# Patient Record
Sex: Female | Born: 1980 | Smoking: Current every day smoker
Health system: Southern US, Community
[De-identification: ages and names within clinical notes are randomized; demographics above are authoritative.]

## PROBLEM LIST (undated history)

## (undated) ENCOUNTER — Inpatient Hospital Stay (HOSPITAL_COMMUNITY): Payer: Self-pay

## (undated) DIAGNOSIS — F1110 Opioid abuse, uncomplicated: Secondary | ICD-10-CM

## (undated) DIAGNOSIS — O24419 Gestational diabetes mellitus in pregnancy, unspecified control: Secondary | ICD-10-CM

## (undated) DIAGNOSIS — E119 Type 2 diabetes mellitus without complications: Secondary | ICD-10-CM

## (undated) DIAGNOSIS — F32A Depression, unspecified: Secondary | ICD-10-CM

## (undated) DIAGNOSIS — F329 Major depressive disorder, single episode, unspecified: Secondary | ICD-10-CM

## (undated) DIAGNOSIS — F419 Anxiety disorder, unspecified: Secondary | ICD-10-CM

## (undated) HISTORY — PX: EYE SURGERY: SHX253

## (undated) HISTORY — DX: Type 2 diabetes mellitus without complications: E11.9

---
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
## 2002-09-24 ENCOUNTER — Other Ambulatory Visit: Admission: RE | Admit: 2002-09-24 | Discharge: 2002-09-24 | Payer: Self-pay | Admitting: *Deleted

## 2003-02-16 ENCOUNTER — Ambulatory Visit (HOSPITAL_COMMUNITY): Admission: RE | Admit: 2003-02-16 | Discharge: 2003-02-16 | Payer: Self-pay | Admitting: Unknown Physician Specialty

## 2003-02-16 ENCOUNTER — Encounter: Payer: Self-pay | Admitting: Unknown Physician Specialty

## 2003-03-31 ENCOUNTER — Ambulatory Visit (HOSPITAL_COMMUNITY): Admission: RE | Admit: 2003-03-31 | Discharge: 2003-03-31 | Payer: Self-pay | Admitting: Unknown Physician Specialty

## 2003-03-31 ENCOUNTER — Encounter: Payer: Self-pay | Admitting: Unknown Physician Specialty

## 2004-04-14 ENCOUNTER — Emergency Department (HOSPITAL_COMMUNITY): Admission: EM | Admit: 2004-04-14 | Discharge: 2004-04-14 | Payer: Self-pay | Admitting: Emergency Medicine

## 2004-07-19 ENCOUNTER — Emergency Department (HOSPITAL_COMMUNITY): Admission: EM | Admit: 2004-07-19 | Discharge: 2004-07-19 | Payer: Self-pay | Admitting: Emergency Medicine

## 2004-07-29 ENCOUNTER — Emergency Department (HOSPITAL_COMMUNITY): Admission: EM | Admit: 2004-07-29 | Discharge: 2004-07-29 | Payer: Self-pay | Admitting: Emergency Medicine

## 2005-05-08 ENCOUNTER — Ambulatory Visit: Payer: Self-pay | Admitting: Nurse Practitioner

## 2005-05-17 ENCOUNTER — Ambulatory Visit (HOSPITAL_COMMUNITY): Admission: RE | Admit: 2005-05-17 | Discharge: 2005-05-17 | Payer: Self-pay | Admitting: Internal Medicine

## 2005-07-24 ENCOUNTER — Other Ambulatory Visit: Admission: RE | Admit: 2005-07-24 | Discharge: 2005-07-24 | Payer: Self-pay | Admitting: Obstetrics and Gynecology

## 2005-08-19 IMAGING — CR DG HAND COMPLETE 3+V*L*
2 series · 2 of 2 positions shown · non-contrast
Comparison: none

HISTORY: Pain, swelling, injury

LEFT FOREARM 2 VIEWS:
No fracture, dislocation, or bone destruction.
Joint spaces preserved.

[view not recorded (1 of 2)]
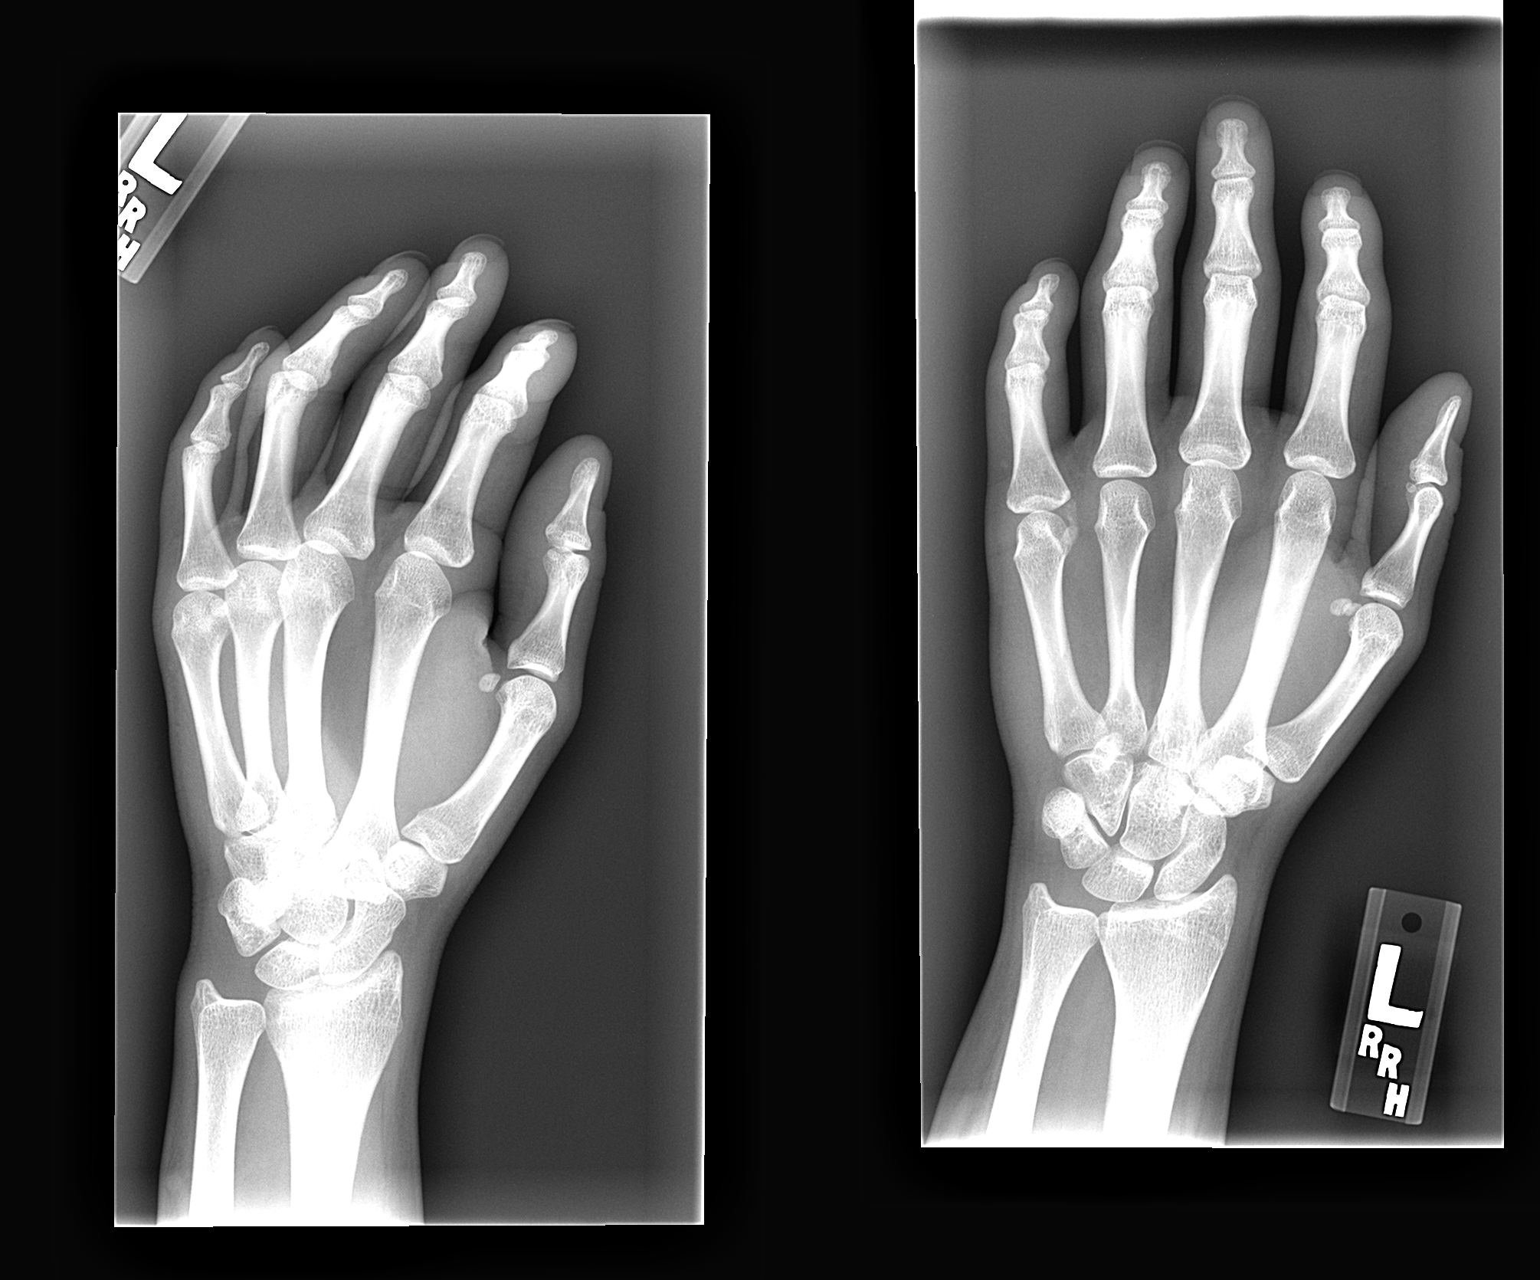

[view not recorded (2 of 2)]
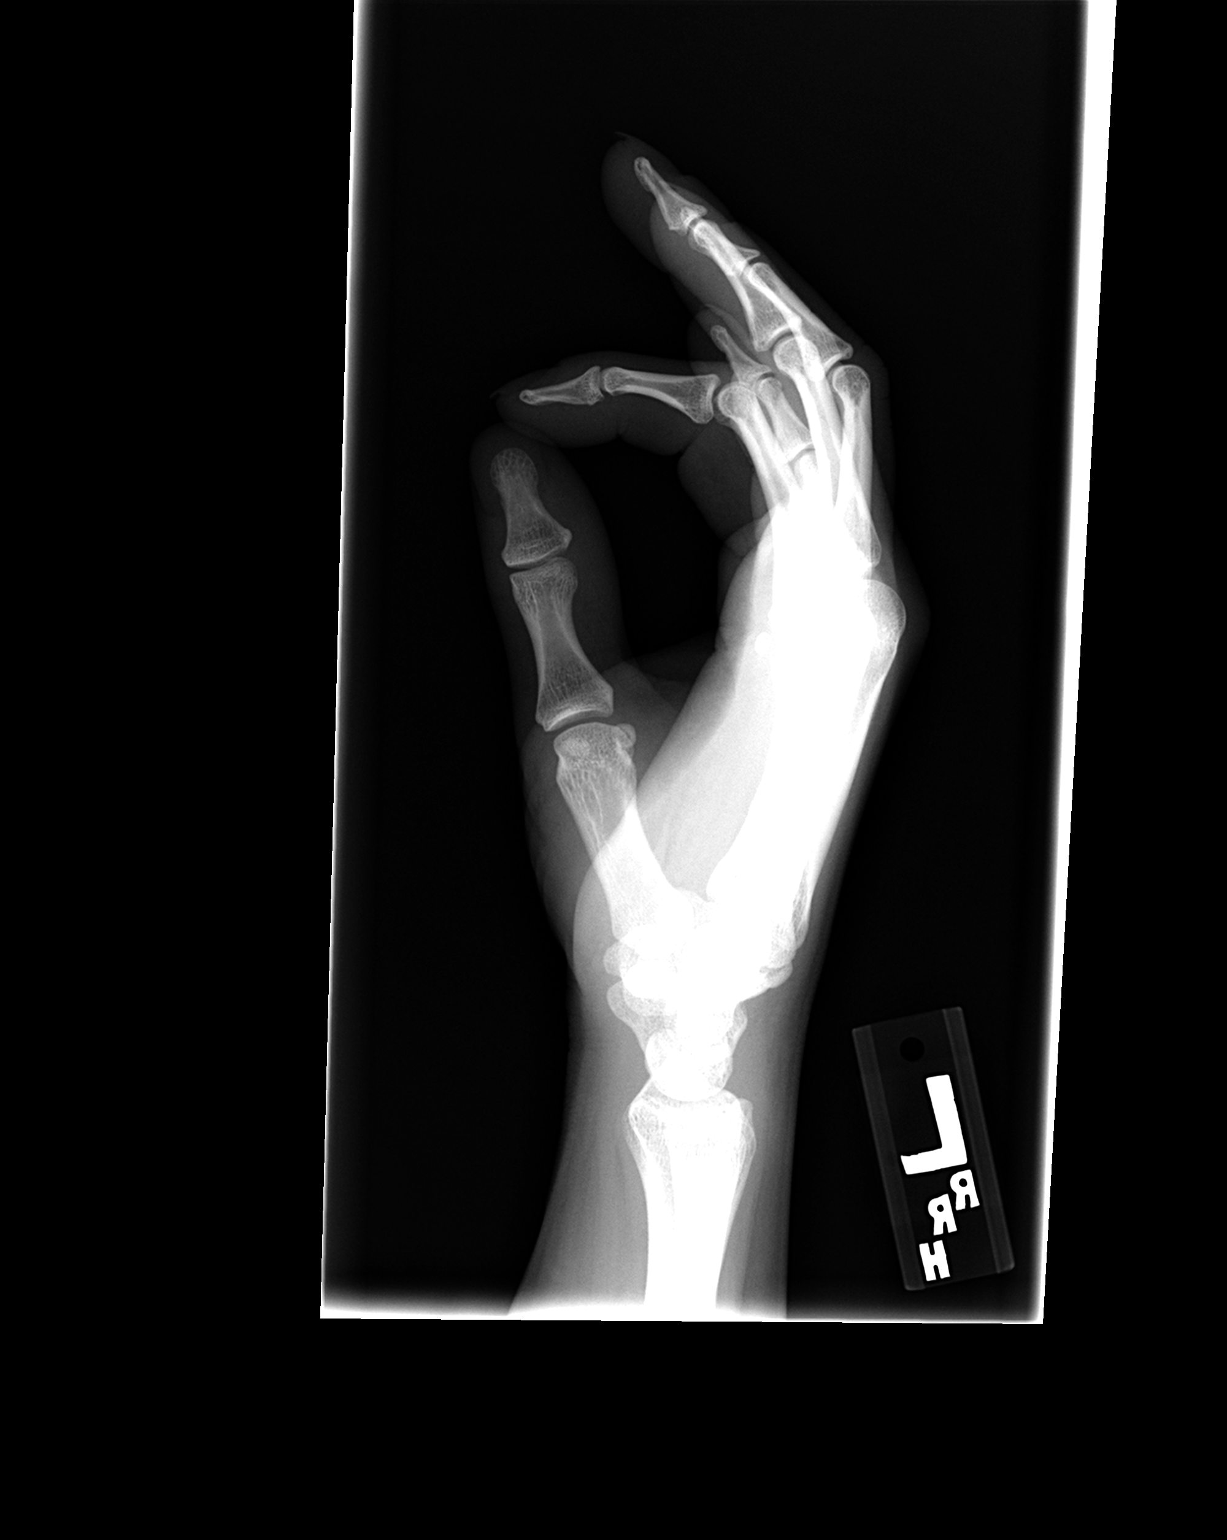

[2 of 2 positions shown; findings below may reference images not displayed]

IMPRESSION: No acute abnormalities.

LEFT HAND 3 VIEWS:

Fingers flexed on all views.
No definite fracture, dislocation, or bone destruction.
Mineralization normal.
Joint spaces preserved.
On AP view, question vague calcification identified at radial aspect of fifth
MCP joint, of uncertain etiology and significance.
This is not well visualized on the oblique or lateral films.
IMPRESSION: No definite acute bony abnormalities.
Questionable density at the MCP joint on single image, of uncertain significance
No donor site seen to suggest subacute fracture.

## 2005-09-19 ENCOUNTER — Emergency Department (HOSPITAL_COMMUNITY): Admission: EM | Admit: 2005-09-19 | Discharge: 2005-09-19 | Payer: Self-pay | Admitting: Emergency Medicine

## 2006-01-08 ENCOUNTER — Encounter: Admission: RE | Admit: 2006-01-08 | Discharge: 2006-01-16 | Payer: Self-pay | Admitting: Obstetrics & Gynecology

## 2006-02-28 ENCOUNTER — Inpatient Hospital Stay (HOSPITAL_COMMUNITY): Admission: AD | Admit: 2006-02-28 | Discharge: 2006-03-06 | Payer: Self-pay | Admitting: Gynecology

## 2006-03-02 ENCOUNTER — Encounter (INDEPENDENT_AMBULATORY_CARE_PROVIDER_SITE_OTHER): Payer: Self-pay | Admitting: Specialist

## 2006-03-09 ENCOUNTER — Encounter: Admission: RE | Admit: 2006-03-09 | Discharge: 2006-04-08 | Payer: Self-pay | Admitting: Obstetrics and Gynecology

## 2006-06-08 ENCOUNTER — Other Ambulatory Visit: Admission: RE | Admit: 2006-06-08 | Discharge: 2006-06-08 | Payer: Self-pay | Admitting: Family Medicine

## 2006-06-17 ENCOUNTER — Emergency Department (HOSPITAL_COMMUNITY): Admission: EM | Admit: 2006-06-17 | Discharge: 2006-06-18 | Payer: Self-pay | Admitting: Emergency Medicine

## 2007-05-27 ENCOUNTER — Encounter: Admission: RE | Admit: 2007-05-27 | Discharge: 2007-07-03 | Payer: Self-pay | Admitting: Family Medicine

## 2007-07-08 IMAGING — US US PELVIS COMPLETE MODIFY
1 series · 14 of 25 positions shown · non-contrast
Comparison: None

CLINICAL DATA: Back pain, right-sided pelvic pain

TRANSABDOMINAL AND TRANSVAGINAL PELVIC ULTRASOUND
TECHNIQUE: Both transabdominal and transvaginal ultrasound examinations of the
pelvis were performed including evaluation of the uterus, ovaries, adnexal
regions, and pelvic cul-de-sac.

[Series 1: unknown · 0.32mm/px · 14 of 40 slices shown]
[im 1/40]
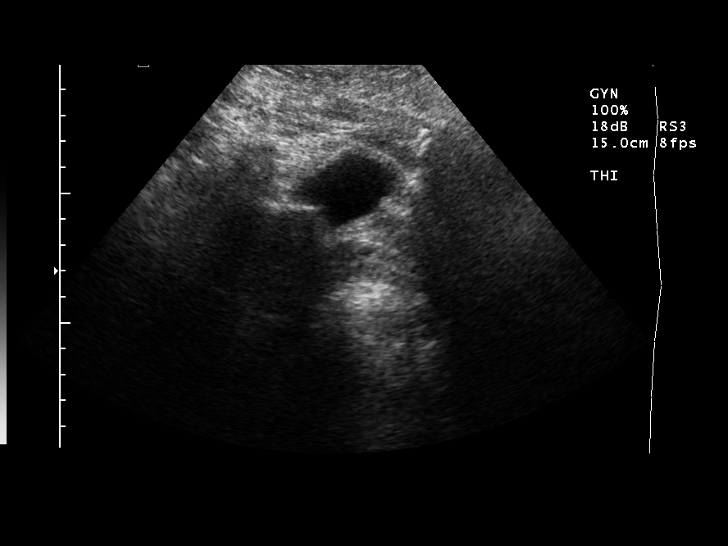
[im 4/40]
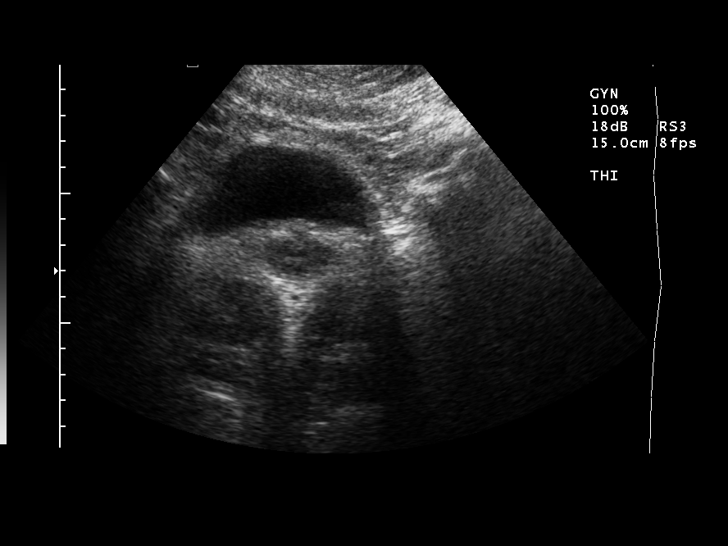
[im 7/40]
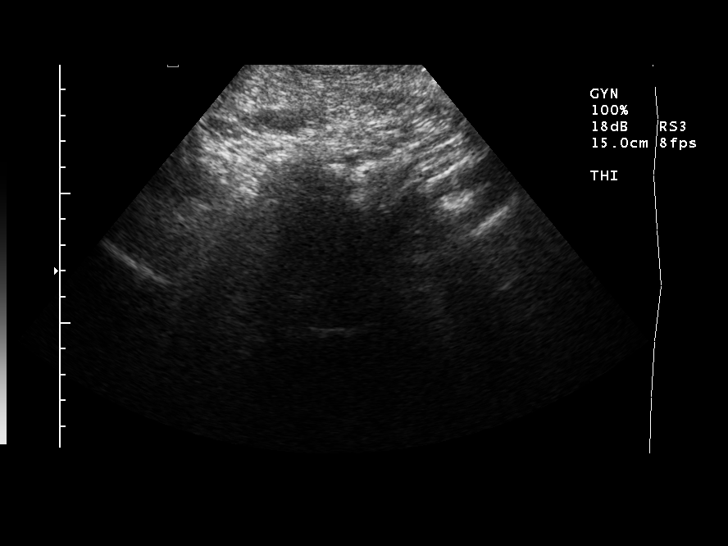
[im 10/40]
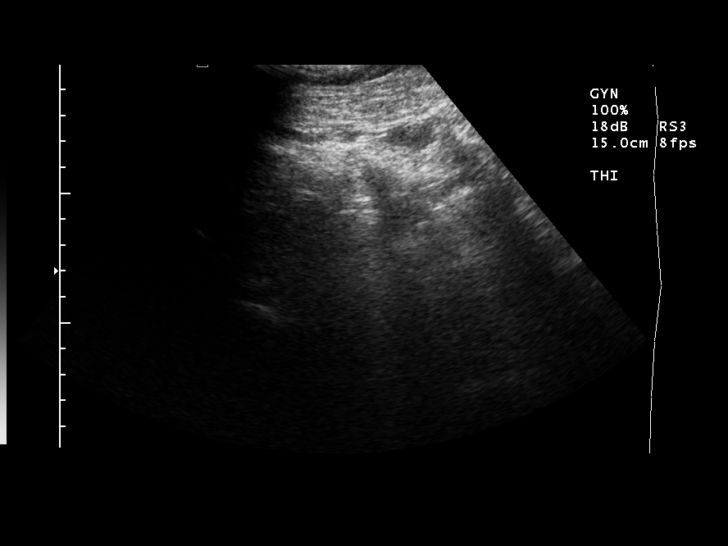
[im 14/40]
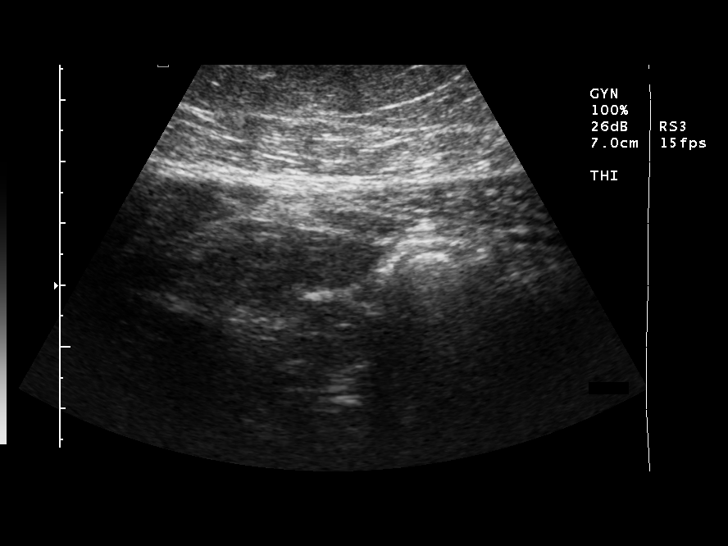
[im 15/40]
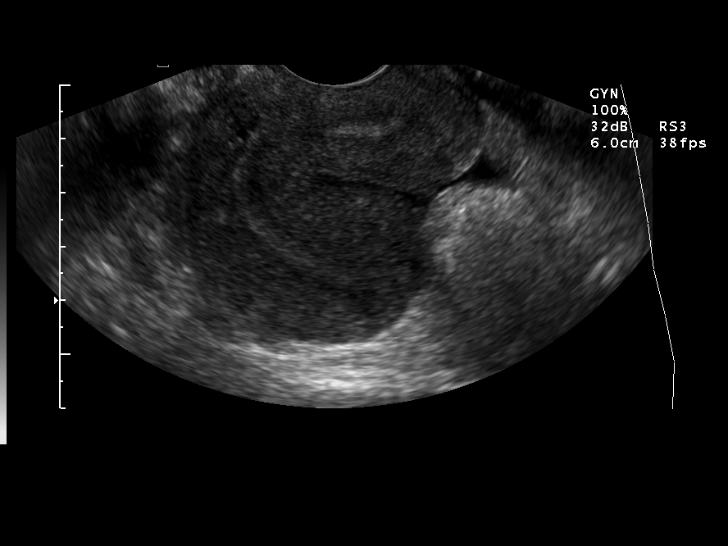
[im 18/40]
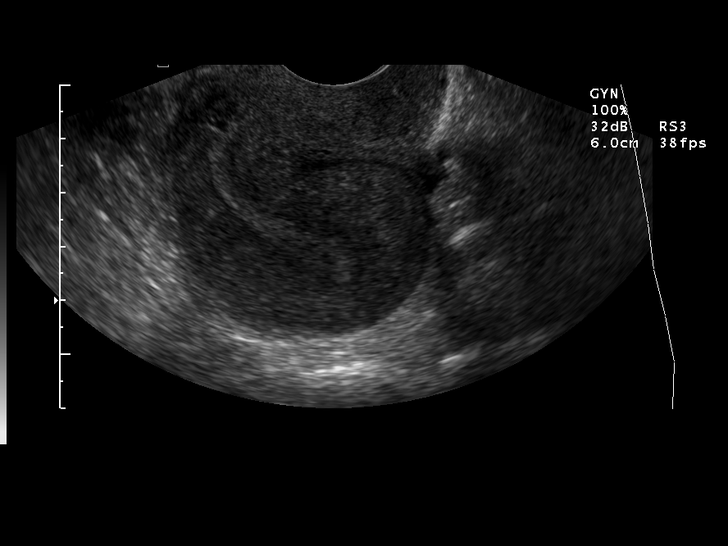
[im 22/40]
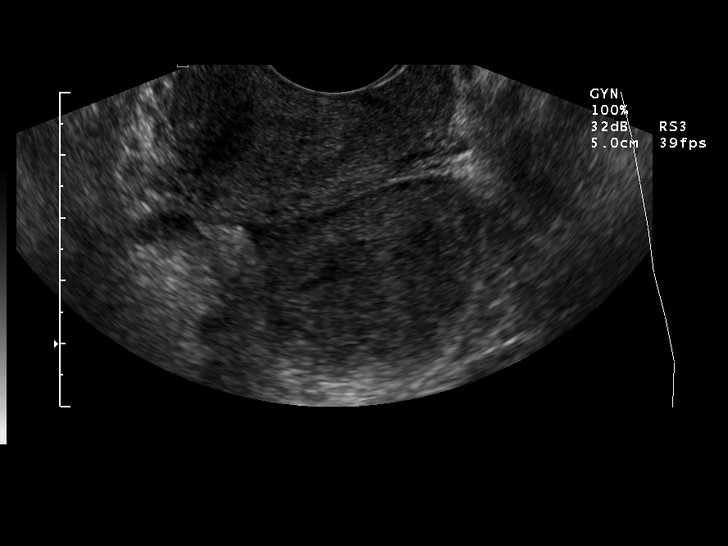
[im 25/40]
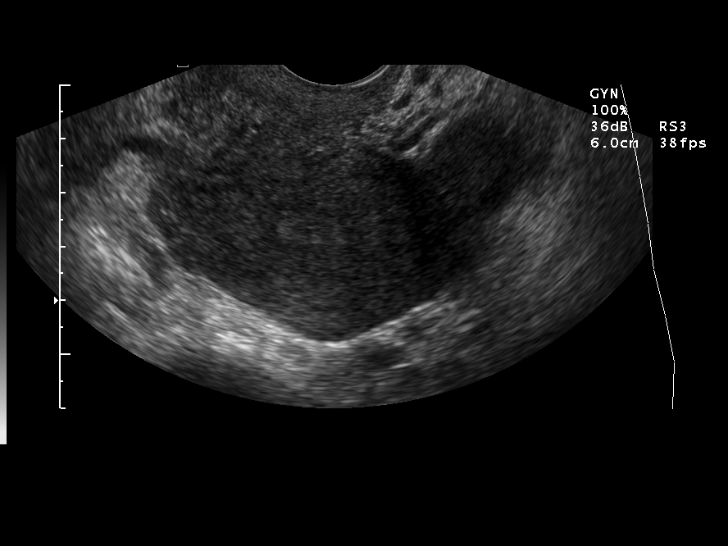
[im 27/40]
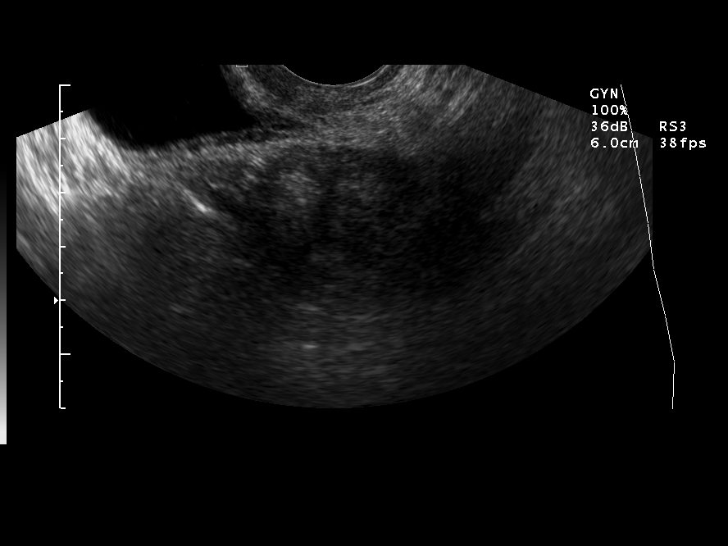
[im 30/40]
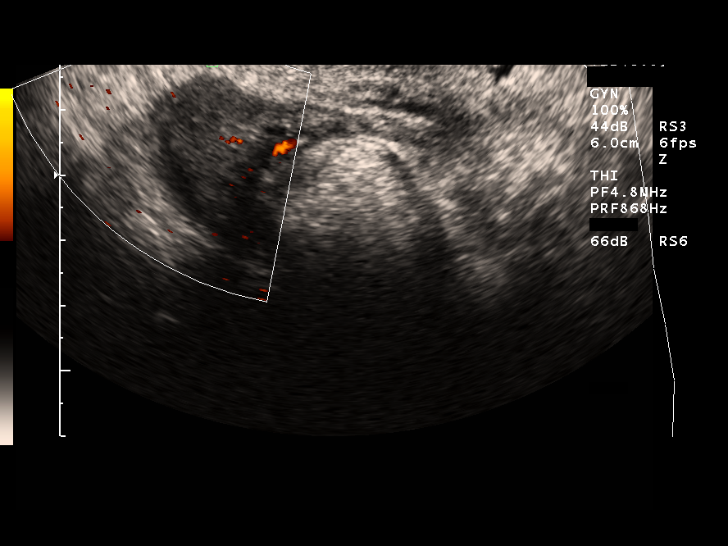
[im 33/40]
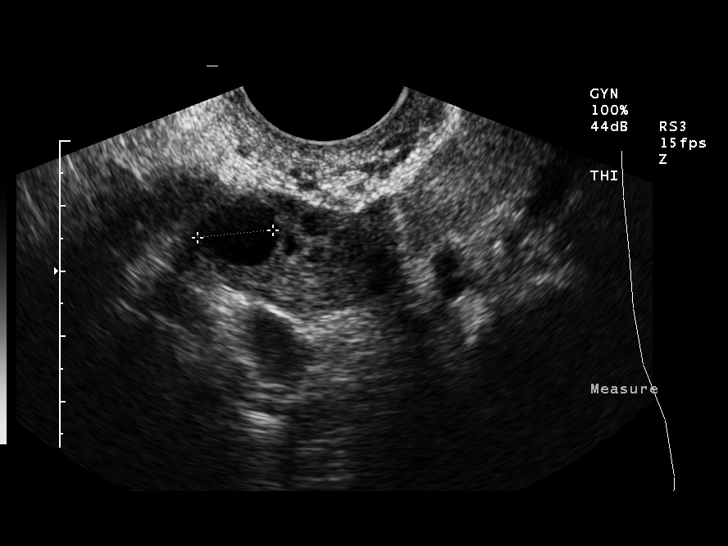
[im 36/40]
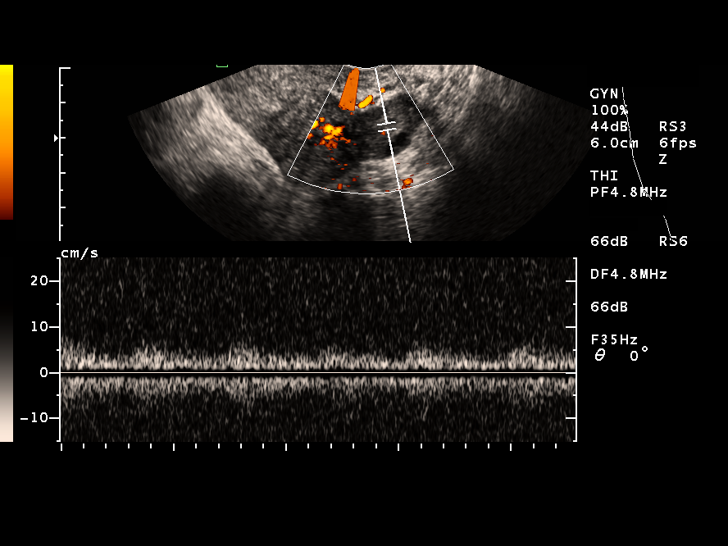
[im 40/40]
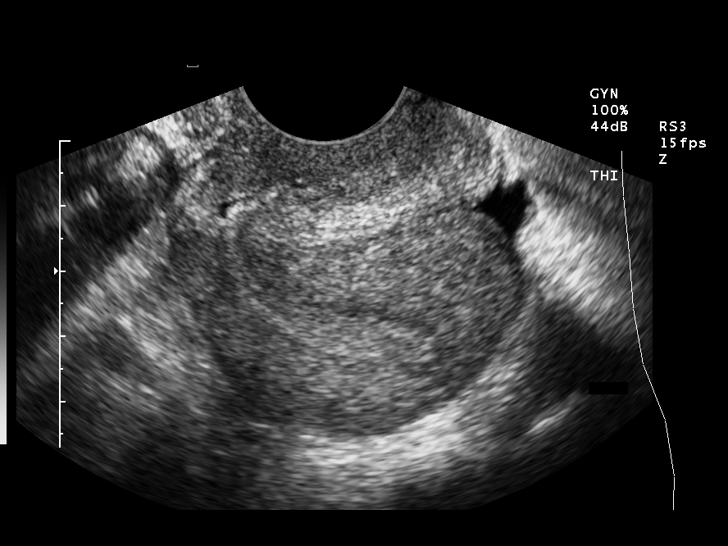

[14 of 25 positions shown; findings below may reference images not displayed]

FINDINGS: Uterus is retroflexed. Endometrial normal at 6 mm. Uterus
unremarkable.

Right ovary measures 2.9 x 2.7 x 1.9 cm. Left ovary measures 3.1 x 1.7 x 2.4 cm.
Multiple small follicles noted bilaterally. A small amount of free fluid is
present.

IMPRESSION

A small amount of free fluid, likely physiologic. Otherwise unremarkable.

## 2008-03-26 ENCOUNTER — Encounter: Admission: RE | Admit: 2008-03-26 | Discharge: 2008-06-08 | Payer: Self-pay | Admitting: Nurse Practitioner

## 2008-05-20 ENCOUNTER — Emergency Department (HOSPITAL_COMMUNITY): Admission: EM | Admit: 2008-05-20 | Discharge: 2008-05-20 | Payer: Self-pay | Admitting: Emergency Medicine

## 2008-05-21 ENCOUNTER — Ambulatory Visit (HOSPITAL_COMMUNITY): Admission: RE | Admit: 2008-05-21 | Discharge: 2008-05-21 | Payer: Self-pay | Admitting: Ophthalmology

## 2008-11-06 ENCOUNTER — Ambulatory Visit: Payer: Self-pay | Admitting: Diagnostic Radiology

## 2008-11-06 ENCOUNTER — Ambulatory Visit (HOSPITAL_BASED_OUTPATIENT_CLINIC_OR_DEPARTMENT_OTHER): Admission: RE | Admit: 2008-11-06 | Discharge: 2008-11-06 | Payer: Self-pay | Admitting: Family Medicine

## 2009-01-27 ENCOUNTER — Ambulatory Visit: Payer: Self-pay | Admitting: Diagnostic Radiology

## 2009-01-27 ENCOUNTER — Ambulatory Visit (HOSPITAL_BASED_OUTPATIENT_CLINIC_OR_DEPARTMENT_OTHER): Admission: RE | Admit: 2009-01-27 | Discharge: 2009-01-27 | Payer: Self-pay | Admitting: Family Medicine

## 2009-02-08 ENCOUNTER — Other Ambulatory Visit: Admission: RE | Admit: 2009-02-08 | Discharge: 2009-02-08 | Payer: Self-pay | Admitting: Family Medicine

## 2010-06-15 ENCOUNTER — Other Ambulatory Visit: Admission: RE | Admit: 2010-06-15 | Discharge: 2010-06-15 | Payer: Self-pay | Admitting: Obstetrics and Gynecology

## 2010-08-28 HISTORY — PX: ROTATOR CUFF REPAIR: SHX139

## 2010-09-18 ENCOUNTER — Encounter: Payer: Self-pay | Admitting: Internal Medicine

## 2011-01-10 NOTE — Op Note (Signed)
NAME:  Audrey Edwards, Audrey Edwards                ACCOUNT NO.:  [id number]   MEDICAL RECORD NO.:  [id number]          PATIENT TYPE:  AMB   LOCATION:  SDS                          FACILITY:  [facility]   PHYSICIAN:  Tyrone Apple. Karleen Edwards, M.D.DATE OF BIRTH:  07-08-1981   DATE OF PROCEDURE:  05/21/2008  DATE OF DISCHARGE:                               OPERATIVE REPORT   PREOPERATIVE DIAGNOSIS:  Complex lid laceration of the right upper lid  posttraumatic.   POSTOPERATIVE DIAGNOSIS:  Complex lid laceration of the right upper lid  posttraumatic.   PROCEDURE:  Repair of complex lid laceration of the right upper lid  involving the tarsal plate and the orbicularis.   SURGEON:  Tyrone Apple. Karleen Hampshire, MD.   ANESTHESIA:  General with endotracheal intubation.   INDICATIONS FOR PROCEDURE:  Audrey Edwards is a 30 year old white female,  who is status post a traumatic lid rupture, and this procedure is  indicated to restore a normal lid anatomy and function.  The risks and  benefits of the procedure explained to the patient and the patient's  family prior to the procedure, and informed consent was obtained.   DESCRIPTION OF PROCEDURE:  The patient was taken into the operating  room, placed in the supine position, and the entire face was prepped and  draped in the usual sterile fashion.  After the induction of our general  anesthesia and the establishment of an endotracheal airway, the  operating microscope was brought into the field.  The laceration was  examined.  It was found to be a rupture of the tarsal plate extending  vertically for approximately 8 to 10 mm and then horizontally for  approximately 12 mm once out of the tarsal plate with displacement of  the lid margins.  Next, the wound was copiously irrigated with Betadine  and also then irrigated with normal saline, and the proximal and distal  margins of the tarsal plate were identified.  Using a 6-0 Vicryl suture,  a suture was placed through the meibomian  orifices approximately 2 to 3  mm from the edge of the distal aspect of the lesion and passed to the  proximal aspect of the wound in the tarsal plate exiting from the  adjacent meibomian gland orifices the same distance from the fracture as  the contralateral suture.  The suture was then tied securely and left  long.  A second 6-0 Vicryl suture was then placed.  This was placed  anterior in the lid margin to the previously placed sutures just  anterior to the lash line, and this was passed through the tarsal plate  through the fracture on the posterior aspect of the wound and then  passed through the tarsal plate of the fracture and exiting on the  posterior aspect of the wound on the contralateral side.  This suture  was also tied securely and left long and then placed on serafins.  Next,  the lateral aspect of the wound extending horizontally was explored and  cleaned, and the subcutaneous tissue and obicularis  fibers were then  engaged with direct visualization, and the wound  defect was closed using  interrupted 8-0 Vicryl sutures along the length of the wound.  Approximately 10 interrupted 8-0 Vicryl sutures were used to close the  laceration.  Once the wound was completely closed, one of the sutures in  the levator pretarsal orbicularis complex was left long, and this was  used to incorporate the previously placed 2 sutures of 6-0 Vicryl  sutures, which were placed in the end-tarsal plate and in this way  pulling the sutures away from the cornea and using this suture to tie  and incarcerate the tarsal plate sutures.  All the sutures were then cut  short.  The wound was then inspected again  for its integrity and its contour.  It was irrigated.  The eye was  examined.  The cornea was also examined and found to be intact.  There  were no retained foreign bodies.  The wound was then irrigated to dry,  and TobraDex ointment was applied.  A protective Fox shield was then  applied to the  eye.  There were no apparent complications.      Audrey Edwards, M.D.  Electronically Signed     MAS/MEDQ  D:  05/21/2008  T:  05/21/2008  Job:  [id number]

## 2011-01-13 NOTE — Discharge Summary (Signed)
NAME:  Audrey Edwards, Audrey Edwards                ACCOUNT NO.:  [id number]   MEDICAL RECORD NO.:  [id number]          PATIENT TYPE:  INP   LOCATION:                                FACILITY:  [facility]   PHYSICIAN:  Malva Limes, M.D.    DATE OF BIRTH:  01-09-1981   DATE OF ADMISSION:  02/28/2006  DATE OF DISCHARGE:  03/06/2006                                 DISCHARGE SUMMARY   FINAL DIAGNOSES:  1.  Intrauterine pregnancy at 38+ weeks gestation.  2.  Oligohydramnios.  3.  Arrest of the active phase of labor.  4.  Chorioamnionitis.  5.  Postoperative anemia.   PROCEDURE:  Primary low transverse cesarean section.   SURGEON:  Dr. Carrington Clamp.   COMPLICATIONS:  None.     This 30 year old, G1 P0, was admitted on the morning of March 01, 2006 for  induction secondary to oligohydramnios and insulin-requiring gestational  diabetes mellitus.  The patient's antepartum course up to this point had  been complicated by a history of chlamydia at the beginning of her pregnancy  which was treated.  Her culture was negative after treatment.  The patient  also had gestational diabetes mellitus which needed insulin for control.  She was being monitored with nonstress test and AFIs.  She was noted to have  oligohydramnios and therefore since she was term induction was planned.  The  patient was admitted and started on Pitocin.  She had a protracted labor  pattern and dilated to about 8 cm.  The patient made no core progress over  the next 2 to 3 hours.  The patient was having adequate contractions  throughout this time.  She was feeling more pressure and pain and started  spiking a temperature of 101.  She was receiving IV ampicillin for this.  At  this point, a discussion was held with the patient and a decision was made  to proceed with a cesarean section secondary to arrest of the active phase  of labor and chorioamnionitis.  The patient had the delivery of a 7 pounds 8  ounce female infant with Apgar's of 9 and  9.  The delivery went without  complications.  The patient's postoperative course was complicated by some  postoperative anemia.  Her hemoglobin dropped to 6.9.  She was on IV  antibiotics and oral Augmentin after delivery for this chorioamnionitis.  By  postoperative day #2, the patient was very dizzy, symptomatic of her anemia.  A discussion was held with the patient and a decision was made to transfer  to two units of red blood cells.  By postoperative day #3, the patient was  feeling less dizzy and ambulating.  She did have a of temperature up to  about 100 and was held in the hospital and continued on oral Augmentin.  By  postoperative day #4, the patient's hemoglobin climbed to 9.8.  Her white  blood cell count was 8.1 and she was felt ready for discharge.  She was  afebrile.  She was sent home on a regular diet, told to decrease activities,  told to  continue her vitamins.  Iron supplement was given, Percocet one to  two every 4-6 hours as needed for pain, told she could use over-the-counter  Motrin up to 600 mg every 6 hours as needed for pain.  She will follow up in  the office in 4 weeks and of course to call with any increased bleeding,  pain or problems.   LABS ON DISCHARGE:  The patient had a hemoglobin of 9.8 which was with up  from a low of 6.5.  she had a white blood cell count of 8.1, platelets  239,000.      Leilani Able, P.A.-C.    ______________________________  Malva Limes, M.D.    MB/MEDQ  D:  03/29/2006  T:  03/29/2006  Job:  [id number]

## 2011-02-27 ENCOUNTER — Other Ambulatory Visit (HOSPITAL_BASED_OUTPATIENT_CLINIC_OR_DEPARTMENT_OTHER): Payer: Self-pay | Admitting: Family Medicine

## 2011-02-27 ENCOUNTER — Ambulatory Visit (HOSPITAL_BASED_OUTPATIENT_CLINIC_OR_DEPARTMENT_OTHER)
Admission: RE | Admit: 2011-02-27 | Discharge: 2011-02-27 | Disposition: A | Discharge status: Home / Self Care | Payer: Medicaid Other | Source: Ambulatory Visit | Attending: Family Medicine | Admitting: Family Medicine

## 2011-02-27 DIAGNOSIS — M7989 Other specified soft tissue disorders: Secondary | ICD-10-CM

## 2011-02-27 DIAGNOSIS — I809 Phlebitis and thrombophlebitis of unspecified site: Secondary | ICD-10-CM

## 2011-02-27 DIAGNOSIS — M79609 Pain in unspecified limb: Secondary | ICD-10-CM

## 2011-05-29 LAB — HCG, SERUM, QUALITATIVE: Preg, Serum: NEGATIVE

## 2011-05-29 LAB — DIFFERENTIAL
Basophils Absolute: 0
Basophils Relative: 0
Eosinophils Absolute: 0.2
Eosinophils Relative: 2
Lymphocytes Relative: 26
Lymphs Abs: 2.4
Monocytes Absolute: 0.6
Monocytes Relative: 7
Neutro Abs: 5.9
Neutrophils Relative %: 65

## 2011-05-29 LAB — POCT I-STAT, CHEM 8
BUN: 14
Calcium, Ion: 1.2
Chloride: 104
Creatinine, Ser: 1
Glucose, Bld: 104 — ABNORMAL HIGH
HCT: 42
Hemoglobin: 14.3
Potassium: 3.8
Sodium: 139
TCO2: 27

## 2011-05-29 LAB — CBC
HCT: 38.9
Hemoglobin: 12.4
MCHC: 31.8
MCV: 67.4 — ABNORMAL LOW
Platelets: 252
RBC: 5.78 — ABNORMAL HIGH
RDW: 14.8
WBC: 9.1

## 2011-05-29 LAB — PROTIME-INR
INR: 1
Prothrombin Time: 12.9

## 2011-06-01 ENCOUNTER — Ambulatory Visit: Payer: Medicaid Other | Attending: Orthopedic Surgery | Admitting: Physical Therapy

## 2011-06-01 DIAGNOSIS — IMO0001 Reserved for inherently not codable concepts without codable children: Secondary | ICD-10-CM | POA: Insufficient documentation

## 2011-06-01 DIAGNOSIS — M25619 Stiffness of unspecified shoulder, not elsewhere classified: Secondary | ICD-10-CM | POA: Insufficient documentation

## 2011-06-01 DIAGNOSIS — M25519 Pain in unspecified shoulder: Secondary | ICD-10-CM | POA: Insufficient documentation

## 2011-06-01 DIAGNOSIS — R5381 Other malaise: Secondary | ICD-10-CM | POA: Insufficient documentation

## 2011-06-06 ENCOUNTER — Ambulatory Visit: Payer: Medicaid Other | Admitting: Physical Therapy

## 2011-06-08 ENCOUNTER — Ambulatory Visit: Payer: Medicaid Other | Admitting: Physical Therapy

## 2011-06-29 ENCOUNTER — Other Ambulatory Visit (HOSPITAL_COMMUNITY)
Admission: RE | Admit: 2011-06-29 | Discharge: 2011-06-29 | Disposition: A | Discharge status: Home / Self Care | Payer: Medicaid Other | Source: Ambulatory Visit | Attending: Obstetrics and Gynecology | Admitting: Obstetrics and Gynecology

## 2011-06-29 ENCOUNTER — Other Ambulatory Visit: Payer: Self-pay | Admitting: Nurse Practitioner

## 2011-06-29 DIAGNOSIS — Z01419 Encounter for gynecological examination (general) (routine) without abnormal findings: Secondary | ICD-10-CM | POA: Insufficient documentation

## 2011-06-29 DIAGNOSIS — Z113 Encounter for screening for infections with a predominantly sexual mode of transmission: Secondary | ICD-10-CM | POA: Insufficient documentation

## 2011-06-29 DIAGNOSIS — N760 Acute vaginitis: Secondary | ICD-10-CM | POA: Insufficient documentation

## 2011-08-29 HISTORY — PX: BUNIONECTOMY: SHX129

## 2012-06-20 ENCOUNTER — Other Ambulatory Visit (HOSPITAL_BASED_OUTPATIENT_CLINIC_OR_DEPARTMENT_OTHER): Payer: Self-pay | Admitting: Family Medicine

## 2012-06-20 DIAGNOSIS — R1013 Epigastric pain: Secondary | ICD-10-CM

## 2012-06-21 ENCOUNTER — Ambulatory Visit (HOSPITAL_BASED_OUTPATIENT_CLINIC_OR_DEPARTMENT_OTHER)
Admission: RE | Admit: 2012-06-21 | Discharge: 2012-06-21 | Disposition: A | Discharge status: Home / Self Care | Payer: Medicaid Other | Source: Ambulatory Visit | Attending: Family Medicine | Admitting: Family Medicine

## 2012-06-21 DIAGNOSIS — R1013 Epigastric pain: Secondary | ICD-10-CM

## 2012-06-21 DIAGNOSIS — R112 Nausea with vomiting, unspecified: Secondary | ICD-10-CM | POA: Insufficient documentation

## 2012-07-22 ENCOUNTER — Other Ambulatory Visit: Payer: Self-pay | Admitting: Obstetrics and Gynecology

## 2012-07-22 LAB — OB RESULTS CONSOLE RPR: RPR: NONREACTIVE

## 2012-07-22 LAB — OB RESULTS CONSOLE ANTIBODY SCREEN: Antibody Screen: NEGATIVE

## 2012-07-22 LAB — OB RESULTS CONSOLE ABO/RH

## 2012-08-29 ENCOUNTER — Encounter: Payer: Medicaid Other | Attending: Obstetrics and Gynecology | Admitting: *Deleted

## 2012-08-29 ENCOUNTER — Encounter: Payer: Self-pay | Admitting: *Deleted

## 2012-08-29 VITALS — Ht 60.0 in | Wt 137.3 lb

## 2012-08-29 DIAGNOSIS — Z713 Dietary counseling and surveillance: Secondary | ICD-10-CM | POA: Insufficient documentation

## 2012-08-29 DIAGNOSIS — O24419 Gestational diabetes mellitus in pregnancy, unspecified control: Secondary | ICD-10-CM

## 2012-08-29 DIAGNOSIS — O99810 Abnormal glucose complicating pregnancy: Secondary | ICD-10-CM | POA: Insufficient documentation

## 2012-08-29 NOTE — Patient Instructions (Signed)
Goals:  Check glucose levels per MD as instructed  Follow Gestational Diabetes Diet as instructed  Call for follow-up as needed    

## 2012-08-29 NOTE — Progress Notes (Signed)
  Patient was seen on 08/29/12 for Gestational Diabetes self-management class at the [facility]. The following learning objectives were met by the patient during this course:   States the definition of Gestational Diabetes  States why dietary management is important in controlling blood glucose  Describes the effects each nutrient has on blood glucose levels  Demonstrates ability to create a balanced meal plan  Demonstrates carbohydrate counting   States when to check blood glucose levels  Demonstrates proper blood glucose monitoring techniques  States the effect of stress and exercise on blood glucose levels  States the importance of limiting caffeine and abstaining from alcohol and smoking  Blood glucose monitor given: Accu Chek Avivia Plus BG Monitoring Kit Lot # [id number] Exp: 07/27/13 Blood glucose reading: 171 mg/dl  Patient instructed to monitor glucose levels: FBS: 60 - <90 2 hour: <120  *Patient received handouts:  Nutrition Diabetes and Pregnancy  Carbohydrate Counting List  Patient will be seen for follow-up as needed.

## 2013-01-05 ENCOUNTER — Inpatient Hospital Stay (HOSPITAL_COMMUNITY)
Admission: AD | Admit: 2013-01-05 | Discharge: 2013-01-05 | Disposition: A | Discharge status: Home / Self Care | Payer: Medicaid Other | Source: Ambulatory Visit | Attending: Obstetrics & Gynecology | Admitting: Obstetrics & Gynecology

## 2013-01-05 ENCOUNTER — Encounter (HOSPITAL_COMMUNITY): Payer: Self-pay | Admitting: *Deleted

## 2013-01-05 DIAGNOSIS — O368190 Decreased fetal movements, unspecified trimester, not applicable or unspecified: Secondary | ICD-10-CM | POA: Insufficient documentation

## 2013-01-05 DIAGNOSIS — Z36 Encounter for antenatal screening of mother: Secondary | ICD-10-CM

## 2013-01-05 DIAGNOSIS — O99810 Abnormal glucose complicating pregnancy: Secondary | ICD-10-CM | POA: Insufficient documentation

## 2013-01-05 DIAGNOSIS — Z3689 Encounter for other specified antenatal screening: Secondary | ICD-10-CM

## 2013-01-05 DIAGNOSIS — O4700 False labor before 37 completed weeks of gestation, unspecified trimester: Secondary | ICD-10-CM | POA: Insufficient documentation

## 2013-01-05 HISTORY — DX: Anxiety disorder, unspecified: F41.9

## 2013-01-05 HISTORY — DX: Gestational diabetes mellitus in pregnancy, unspecified control: O24.419

## 2013-01-05 NOTE — MAU Note (Signed)
G2 P1, prev C/S at 31wks no fetal movement  Since 6pm Friday.

## 2013-01-05 NOTE — MAU Provider Note (Signed)
  History     CSN: [id number]  Arrival date and time: 01/05/13 0041   First Provider Initiated Contact with Patient 01/05/13 0134      Chief Complaint  Patient presents with  . Decreased Fetal Movement   HPI  Pt is a G2P1001 at 31 wks IUP here with report of decreased fetal movement since 1800 Friday until this evening.  No report of contractions, vaginal bleeding, or leaking of fluid.  Gestational diabetic on glyburide.    Past Medical History  Diagnosis Date  . Diabetes mellitus without complication   . Gestational diabetes   . Anxiety     Past Surgical History  Procedure Laterality Date  . Cesarean section    . Rotator cuff repair  2012  . Bunionectomy  2013  . Eye surgery      Family History  Problem Relation Age of Onset  . Obesity Other   . Stroke Other     History  Substance Use Topics  . Smoking status: Current Some Day Smoker -- 0.30 packs/day    Types: Cigarettes  . Smokeless tobacco: Not on file  . Alcohol Use: Not on file    Allergies:  Allergies  Allergen Reactions  . Tramadol Nausea And Vomiting    Prescriptions prior to admission  Medication Sig Dispense Refill  . glyBURIDE (DIABETA) 2.5 MG tablet Take 2.5 mg by mouth daily with breakfast.      . Prenatal Vit-Fe Fumarate-FA (MULTIVITAMIN-PRENATAL) 27-0.8 MG TABS Take 1 tablet by mouth daily at 12 noon.        Review of Systems  Constitutional:       Decreased fetal movement  Gastrointestinal: Positive for abdominal pain (gas pain).  Neurological: Positive for headaches.  All other systems reviewed and are negative.   Physical Exam   Blood pressure 134/69, pulse 92, temperature 98.6 F (37 C), temperature source Oral, resp. rate 18, height 5' (1.524 m), weight 71.668 kg (158 lb), last menstrual period 06/07/2012.  Physical Exam  Constitutional: She is oriented to person, place, and time. She appears well-developed and well-nourished. No distress.  HENT:  Head: Normocephalic.   Neck: Normal range of motion. Neck supple.  Cardiovascular: Normal rate, regular rhythm and normal heart sounds.   Respiratory: Effort normal and breath sounds normal.  GI: Soft. There is no tenderness.  Genitourinary: No bleeding around the vagina. Vaginal discharge (mucusy) found.  Neurological: She is alert and oriented to person, place, and time.  Skin: Skin is warm and dry. No rash noted.   FHR 120's, +accels, reactive Toco - irregular  Dilation: Closed Effacement (%): Thick Cervical Position: Posterior Exam by:: Roney Marion, CNM   MAU Course  Procedures  No results found for this or any previous visit (from the past 24 hour(s)).  Consulted with Dr. Arlyce Dice > reviewed HPI/exam/fetal tracing > DC to home with follow-up in office  Assessment and Plan  Reassuring Fetal Tracing - Category I  Plan: DC to home Keep scheduled appointment  [facility] 01/05/2013, 1:36 AM

## 2013-01-13 LAB — OB RESULTS CONSOLE GBS: GBS: NEGATIVE

## 2013-02-26 ENCOUNTER — Inpatient Hospital Stay (HOSPITAL_COMMUNITY): Payer: Medicaid Other | Admitting: Anesthesiology

## 2013-02-26 ENCOUNTER — Inpatient Hospital Stay (HOSPITAL_COMMUNITY)
Admission: AD | Admit: 2013-02-26 | Discharge: 2013-03-02 | DRG: 765 | Disposition: A | Discharge status: Home / Self Care | Payer: Medicaid Other | Source: Ambulatory Visit | Attending: Obstetrics and Gynecology | Admitting: Obstetrics and Gynecology

## 2013-02-26 ENCOUNTER — Encounter (HOSPITAL_COMMUNITY)
Admission: AD | Disposition: A | Discharge status: Home / Self Care | Payer: Self-pay | Source: Ambulatory Visit | Attending: Obstetrics and Gynecology

## 2013-02-26 ENCOUNTER — Encounter (HOSPITAL_COMMUNITY): Payer: Self-pay | Admitting: Anesthesiology

## 2013-02-26 ENCOUNTER — Encounter (HOSPITAL_COMMUNITY): Payer: Self-pay | Admitting: *Deleted

## 2013-02-26 DIAGNOSIS — O2432 Unspecified pre-existing diabetes mellitus in childbirth: Secondary | ICD-10-CM | POA: Diagnosis present

## 2013-02-26 DIAGNOSIS — Z794 Long term (current) use of insulin: Secondary | ICD-10-CM

## 2013-02-26 DIAGNOSIS — O34219 Maternal care for unspecified type scar from previous cesarean delivery: Secondary | ICD-10-CM | POA: Diagnosis present

## 2013-02-26 DIAGNOSIS — E119 Type 2 diabetes mellitus without complications: Secondary | ICD-10-CM | POA: Diagnosis present

## 2013-02-26 DIAGNOSIS — O368190 Decreased fetal movements, unspecified trimester, not applicable or unspecified: Principal | ICD-10-CM | POA: Diagnosis present

## 2013-02-26 LAB — COMPREHENSIVE METABOLIC PANEL
AST: 16 U/L (ref 0–37)
Albumin: 2.6 g/dL — ABNORMAL LOW (ref 3.5–5.2)
Alkaline Phosphatase: 179 U/L — ABNORMAL HIGH (ref 39–117)
Chloride: 103 mEq/L (ref 96–112)
Potassium: 3.8 mEq/L (ref 3.5–5.1)
Sodium: 136 mEq/L (ref 135–145)
Total Bilirubin: 0.1 mg/dL — ABNORMAL LOW (ref 0.3–1.2)
Total Protein: 5.9 g/dL — ABNORMAL LOW (ref 6.0–8.3)

## 2013-02-26 LAB — POCT FERN TEST: POCT Fern Test: POSITIVE

## 2013-02-26 LAB — GLUCOSE, CAPILLARY

## 2013-02-26 LAB — CBC
MCHC: 31.8 g/dL (ref 30.0–36.0)
Platelets: 204 10*3/uL (ref 150–400)
RDW: 15.6 % — ABNORMAL HIGH (ref 11.5–15.5)
WBC: 10.1 10*3/uL (ref 4.0–10.5)

## 2013-02-26 LAB — PREPARE RBC (CROSSMATCH)

## 2013-02-26 SURGERY — Surgical Case
Anesthesia: Spinal | Site: Abdomen | Wound class: Clean Contaminated

## 2013-02-26 MED ORDER — OXYTOCIN 10 UNIT/ML IJ SOLN
40.0000 [IU] | INTRAVENOUS | Status: DC | PRN
  Administered 2013-02-26: 40 [IU] via INTRAVENOUS

## 2013-02-26 MED ORDER — ONDANSETRON HCL 4 MG/2ML IJ SOLN
INTRAMUSCULAR | Status: DC | PRN
  Administered 2013-02-26: 4 mg via INTRAVENOUS

## 2013-02-26 MED ORDER — METOCLOPRAMIDE HCL 5 MG/ML IJ SOLN
10.0000 mg | Freq: Once | INTRAMUSCULAR | Status: AC
  Administered 2013-02-26: 10 mg via INTRAVENOUS
  Filled 2013-02-26: qty 2

## 2013-02-26 MED ORDER — FAMOTIDINE IN NACL 20-0.9 MG/50ML-% IV SOLN
20.0000 mg | Freq: Once | INTRAVENOUS | Status: AC
  Administered 2013-02-26: 20 mg via INTRAVENOUS
  Filled 2013-02-26: qty 50

## 2013-02-26 MED ORDER — FENTANYL CITRATE 0.05 MG/ML IJ SOLN
INTRAMUSCULAR | Status: DC | PRN
  Administered 2013-02-26: 50 ug via INTRAVENOUS
  Administered 2013-02-26: 37.5 ug via INTRAVENOUS

## 2013-02-26 MED ORDER — MORPHINE SULFATE (PF) 0.5 MG/ML IJ SOLN
INTRAMUSCULAR | Status: DC | PRN
  Administered 2013-02-26: .2 mg via INTRATHECAL
  Administered 2013-02-26: 4.8 mg via INTRAVENOUS

## 2013-02-26 MED ORDER — KETOROLAC TROMETHAMINE 60 MG/2ML IM SOLN
60.0000 mg | Freq: Once | INTRAMUSCULAR | Status: AC | PRN
  Administered 2013-02-27: 60 mg via INTRAMUSCULAR

## 2013-02-26 MED ORDER — FENTANYL CITRATE 0.05 MG/ML IJ SOLN
INTRAMUSCULAR | Status: AC
  Filled 2013-02-26: qty 2

## 2013-02-26 MED ORDER — CEFAZOLIN SODIUM-DEXTROSE 2-3 GM-% IV SOLR
2.0000 g | Freq: Once | INTRAVENOUS | Status: AC
  Administered 2013-02-26: 2 g via INTRAVENOUS
  Filled 2013-02-26: qty 50

## 2013-02-26 MED ORDER — MEPERIDINE HCL 25 MG/ML IJ SOLN
6.2500 mg | INTRAMUSCULAR | Status: DC | PRN
  Administered 2013-02-27: 6.25 mg via INTRAVENOUS

## 2013-02-26 MED ORDER — FENTANYL CITRATE 0.05 MG/ML IJ SOLN
25.0000 ug | INTRAMUSCULAR | Status: DC | PRN
  Administered 2013-02-27 (×2): 50 ug via INTRAVENOUS

## 2013-02-26 MED ORDER — LACTATED RINGERS IV SOLN
INTRAVENOUS | Status: DC | PRN
  Administered 2013-02-26 (×3): via INTRAVENOUS

## 2013-02-26 MED ORDER — BUPIVACAINE IN DEXTROSE 0.75-8.25 % IT SOLN
INTRATHECAL | Status: DC | PRN
  Administered 2013-02-26: 1.4 mg via INTRATHECAL

## 2013-02-26 MED ORDER — SCOPOLAMINE 1 MG/3DAYS TD PT72
1.0000 | MEDICATED_PATCH | Freq: Once | TRANSDERMAL | Status: AC
  Administered 2013-02-26: 1.5 mg via TRANSDERMAL

## 2013-02-26 MED ORDER — LACTATED RINGERS IV SOLN
INTRAVENOUS | Status: DC
  Administered 2013-02-26 (×2): via INTRAVENOUS

## 2013-02-26 MED ORDER — PHENYLEPHRINE 40 MCG/ML (10ML) SYRINGE FOR IV PUSH (FOR BLOOD PRESSURE SUPPORT)
PREFILLED_SYRINGE | INTRAVENOUS | Status: AC
  Filled 2013-02-26: qty 5

## 2013-02-26 MED ORDER — MORPHINE SULFATE 0.5 MG/ML IJ SOLN
INTRAMUSCULAR | Status: AC
  Filled 2013-02-26: qty 10

## 2013-02-26 MED ORDER — ONDANSETRON HCL 4 MG/2ML IJ SOLN
INTRAMUSCULAR | Status: AC
  Filled 2013-02-26: qty 2

## 2013-02-26 MED ORDER — PHENYLEPHRINE HCL 10 MG/ML IJ SOLN
INTRAMUSCULAR | Status: DC | PRN
  Administered 2013-02-26: 80 ug via INTRAVENOUS
  Administered 2013-02-26 (×3): 40 ug via INTRAVENOUS

## 2013-02-26 MED ORDER — FENTANYL CITRATE 0.05 MG/ML IJ SOLN
INTRAMUSCULAR | Status: DC | PRN
  Administered 2013-02-26: 12.5 ug via INTRATHECAL

## 2013-02-26 MED ORDER — OXYTOCIN 10 UNIT/ML IJ SOLN
INTRAMUSCULAR | Status: AC
  Filled 2013-02-26: qty 4

## 2013-02-26 MED ORDER — CITRIC ACID-SODIUM CITRATE 334-500 MG/5ML PO SOLN
30.0000 mL | Freq: Once | ORAL | Status: AC
  Administered 2013-02-26: 30 mL via ORAL
  Filled 2013-02-26: qty 15

## 2013-02-26 MED ORDER — LACTATED RINGERS IV SOLN
INTRAVENOUS | Status: DC | PRN
  Administered 2013-02-26: 23:00:00 via INTRAVENOUS

## 2013-02-26 SURGICAL SUPPLY — 33 items
CATH FOLEY 2WAY SLVR  5CC 14FR (CATHETERS) ×1
CATH FOLEY 2WAY SLVR 5CC 14FR (CATHETERS) ×1 IMPLANT
CLAMP CORD UMBIL (MISCELLANEOUS) IMPLANT
CLOTH BEACON ORANGE TIMEOUT ST (SAFETY) ×2 IMPLANT
DRAPE LG THREE QUARTER DISP (DRAPES) ×2 IMPLANT
DRSG OPSITE 6X11 MED (GAUZE/BANDAGES/DRESSINGS) ×1 IMPLANT
DRSG OPSITE POSTOP 4X10 (GAUZE/BANDAGES/DRESSINGS) ×1 IMPLANT
DURAPREP 26ML APPLICATOR (WOUND CARE) ×2 IMPLANT
ELECT REM PT RETURN 9FT ADLT (ELECTROSURGICAL) ×2
ELECTRODE REM PT RTRN 9FT ADLT (ELECTROSURGICAL) ×1 IMPLANT
EXTRACTOR VACUUM M CUP 4 TUBE (SUCTIONS) IMPLANT
GLOVE BIO SURGEON STRL SZ 6.5 (GLOVE) ×2 IMPLANT
GLOVE BIOGEL PI IND STRL 6.5 (GLOVE) ×1 IMPLANT
GLOVE BIOGEL PI INDICATOR 6.5 (GLOVE) ×1
GOWN STRL REIN XL XLG (GOWN DISPOSABLE) ×4 IMPLANT
KIT ABG SYR 3ML LUER SLIP (SYRINGE) IMPLANT
NDL HYPO 25X5/8 SAFETYGLIDE (NEEDLE) IMPLANT
NEEDLE HYPO 25X5/8 SAFETYGLIDE (NEEDLE) IMPLANT
NS IRRIG 1000ML POUR BTL (IV SOLUTION) ×2 IMPLANT
PACK C SECTION WH (CUSTOM PROCEDURE TRAY) ×2 IMPLANT
PAD OB MATERNITY 4.3X12.25 (PERSONAL CARE ITEMS) ×2 IMPLANT
RTRCTR C-SECT PINK 25CM LRG (MISCELLANEOUS) ×2 IMPLANT
STAPLER VISISTAT 35W (STAPLE) IMPLANT
SUT MON AB 2-0 CT1 27 (SUTURE) ×2 IMPLANT
SUT MON AB 4-0 PS1 27 (SUTURE) IMPLANT
SUT PDS AB 0 CTX 60 (SUTURE) IMPLANT
SUT PLAIN 2 0 XLH (SUTURE) IMPLANT
SUT VIC AB 0 CTX 36 (SUTURE) ×8
SUT VIC AB 0 CTX36XBRD ANBCTRL (SUTURE) ×4 IMPLANT
SUT VIC AB 4-0 KS 27 (SUTURE) IMPLANT
TOWEL OR 17X24 6PK STRL BLUE (TOWEL DISPOSABLE) ×6 IMPLANT
TRAY FOLEY CATH 14FR (SET/KITS/TRAYS/PACK) ×2 IMPLANT
WATER STERILE IRR 1000ML POUR (IV SOLUTION) ×1 IMPLANT

## 2013-02-26 NOTE — MAU Note (Signed)
States was prescribed Percocet "a long time ago" and states she has been taking during pregnancy for chronic back and foot pain. States she takes 1-2 tablets most every day. States she was given more percocet by someone else. States she did not pay for it and does not wish to disclose the person. Voices concern for her baby and wants to make sure baby is OK. States "I just know she will go through withdrawal." Reasurred that neonatologist will be at C/S and will assess her baby. Pt. upset and crying. Husband not at bedside and patient states he does not know about the percocet use.

## 2013-02-26 NOTE — MAU Note (Signed)
Started leaking around 1600, every time she moves or stands more comes out, clear fluid pinkish. . Cramping in stomach, constant back pain.  Gest diab- glyburide.  Supposed to have a c/s next week.

## 2013-02-26 NOTE — Anesthesia Preprocedure Evaluation (Addendum)
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    GI/Hepatic negative GI ROS, Neg liver ROS,   Endo/Other  diabetes, Gestational, Oral Hypoglycemic AgentsBMI 33.3  Renal/GU negative Renal ROS  negative genitourinary   Musculoskeletal   Abdominal   Peds  Hematology negative hematology ROS (+)   Anesthesia Other Findings Ate 2 slices of pizza at 3:30 pm H/o transfusion during/after last c/s - will type and cross for 2 units  Reproductive/Obstetrics (+) Pregnancy (h/o c/s x1 with PPH requiring transfusion, SROM)                         Anesthesia Physical Anesthesia Plan  ASA: II  Anesthesia Plan: Spinal   Post-op Pain Management:    Induction:   Airway Management Planned:   Additional Equipment:   Intra-op Plan:   Post-operative Plan:   Informed Consent: I have reviewed the patients History and Physical, chart, labs and discussed the procedure including the risks, benefits and alternatives for the proposed anesthesia with the patient or authorized representative who has indicated his/her understanding and acceptance.     Plan Discussed with: Surgeon and CRNA  Anesthesia Plan Comments:         Anesthesia Quick Evaluation

## 2013-02-26 NOTE — H&P (Signed)
32 y.o. [redacted]w[redacted]d  G2P1001 comes in c/o SROM about 3:30 pm just after eating 2 slices of pizza.  She reports some decreased fetal movement and no bleeding.  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  She also reports that she had a hemorrhage with her last c/s requiring a transfusion.  Past Medical History  Diagnosis Date  . Diabetes mellitus without complication   . Gestational diabetes   . Anxiety     Past Surgical History  Procedure Laterality Date  . Cesarean section    . Rotator cuff repair  2012  . Bunionectomy  2013  . Eye surgery      OB History   Grav Para Term Preterm Abortions TAB SAB Ect Mult Living   2 1 1       1      # Outc Date GA Lbr Len/2nd Wgt Sex Del Anes PTL Lv   1 TRM      LTCS   Yes   2 CUR               History   Social History  . Marital Status: Divorced    Spouse Name: N/A    Number of Children: N/A  . Years of Education: N/A   Occupational History  . Not on file.   Social History Main Topics  . Smoking status: Current Some Day Smoker -- 0.30 packs/day    Types: Cigarettes  . Smokeless tobacco: Not on file  . Alcohol Use: Not on file  . Drug Use: Yes    Special: Oxycodone  . Sexually Active: Not on file   Other Topics Concern  . Not on file   Social History Narrative  . No narrative on file   Tramadol    Prenatal Transfer Tool  Maternal Diabetes: Yes:  Diabetes Type:  Insulin/Medication controlled Genetic Screening: Normal Maternal Ultrasounds/Referrals: Normal Fetal Ultrasounds or other Referrals:  None Maternal Substance Abuse:  Yes, percocet Significant Maternal Medications:  None Significant Maternal Lab Results: Lab values include: Group B Strep negative  Other PNC: FOB Hep C+. Patient tested and HepC Neg    Filed Vitals:   02/26/13 1847  BP: 118/71  Pulse: 82  Temp:   Resp:      Lungs/Cor:   NAD Abdomen:  soft, gravid Ex:  no cords, erythema SVE:  1cm FHTs:  145, good STV, NST R Toco:  q3-5   A/P   Admit for Repeat c/s, declines VBAC, SROM Type and Cross for 2U PRBCs Ancef 2g Other routine pre-op care Discussed with anesthesia, will schedule for 11:30pm tonight unless active labor warrants earlier surgery.  GBS Neg  Takao Lizer

## 2013-02-26 NOTE — Brief Op Note (Signed)
02/26/2013  11:38 PM  PATIENT:  Ephraim Hamburger  32 y.o. female  PRE-OPERATIVE DIAGNOSIS:  Previous Cesarean Section Ruptured in Labor  POST-OPERATIVE DIAGNOSIS:  Previous Cesarean Section Ruptured in Labor  PROCEDURE:  Procedure(s): CESAREAN SECTION (N/A)  SURGEON:  Surgeon(s) and Role:    Philip Aspen, DO - Primary  ANESTHESIA:   spinal  EBL:  Total I/O In: 2200 [I.V.:2200] Out: 900 [Urine:300; Blood:600]  BLOOD ADMINISTERED:none  SPECIMEN:  Source of Specimen:  cord blood  DISPOSITION OF SPECIMEN:  N/A  COUNTS:  YES  TOURNIQUET:  * No tourniquets in log *   PLAN OF CARE: Admit to inpatient   PATIENT DISPOSITION:  PACU - hemodynamically stable.   Delay start of Pharmacological VTE agent (>24hrs) due to surgical blood loss or risk of bleeding: not applicable

## 2013-02-26 NOTE — Anesthesia Procedure Notes (Signed)
Spinal  Patient location during procedure: OR Start time: 02/26/2013 10:29 PM End time: 02/26/2013 10:33 PM Staffing Anesthesiologist: Sandrea Hughs Performed by: anesthesiologist  Preanesthetic Checklist Completed: patient identified, surgical consent, pre-op evaluation, timeout performed, IV checked, risks and benefits discussed and monitors and equipment checked Spinal Block Patient position: sitting Prep: DuraPrep Patient monitoring: heart rate, cardiac monitor, continuous pulse ox and blood pressure Approach: midline Location: L3-4 Injection technique: single-shot Needle Needle type: Sprotte  Needle gauge: 24 G Needle length: 9 cm Needle insertion depth: 5 cm Assessment Sensory level: T4

## 2013-02-26 NOTE — Transfer of Care (Signed)
Immediate Anesthesia Transfer of Care Note  Patient: Audrey Edwards  Procedure(s) Performed: Procedure(s): CESAREAN SECTION (N/A)  Patient Location: PACU  Anesthesia Type:Spinal  Level of Consciousness: awake, alert , oriented and patient cooperative  Airway & Oxygen Therapy: Patient Spontanous Breathing  Post-op Assessment: Report given to PACU RN and Post -op Vital signs reviewed and stable  Post vital signs: Reviewed and stable  Complications: No apparent anesthesia complications

## 2013-02-27 ENCOUNTER — Encounter (HOSPITAL_COMMUNITY): Payer: Self-pay | Admitting: *Deleted

## 2013-02-27 LAB — CBC
HCT: 29.1 % — ABNORMAL LOW (ref 36.0–46.0)
Hemoglobin: 9.2 g/dL — ABNORMAL LOW (ref 12.0–15.0)
MCV: 67.1 fL — ABNORMAL LOW (ref 78.0–100.0)
RBC: 4.34 MIL/uL (ref 3.87–5.11)
WBC: 11.5 10*3/uL — ABNORMAL HIGH (ref 4.0–10.5)

## 2013-02-27 LAB — RPR: RPR Ser Ql: NONREACTIVE

## 2013-02-27 LAB — GLUCOSE, CAPILLARY

## 2013-02-27 MED ORDER — MENTHOL 3 MG MT LOZG: 1.0000 | LOZENGE | OROMUCOSAL | Status: DC | PRN

## 2013-02-27 MED ORDER — HYDROMORPHONE HCL 2 MG PO TABS
2.0000 mg | ORAL_TABLET | ORAL | Status: DC | PRN
  Administered 2013-02-27 – 2013-02-28 (×7): 2 mg via ORAL
  Filled 2013-02-27 (×7): qty 1

## 2013-02-27 MED ORDER — NALBUPHINE HCL 10 MG/ML IJ SOLN
5.0000 mg | INTRAMUSCULAR | Status: DC | PRN
  Filled 2013-02-27: qty 1

## 2013-02-27 MED ORDER — SIMETHICONE 80 MG PO CHEW: 80.0000 mg | CHEWABLE_TABLET | ORAL | Status: DC | PRN

## 2013-02-27 MED ORDER — KETOROLAC TROMETHAMINE 30 MG/ML IJ SOLN: 30.0000 mg | Freq: Four times a day (QID) | INTRAMUSCULAR | Status: AC | PRN

## 2013-02-27 MED ORDER — HYDROMORPHONE HCL PF 1 MG/ML IJ SOLN
INTRAMUSCULAR | Status: AC
  Administered 2013-02-27: 1 mg via INTRAVENOUS
  Filled 2013-02-27: qty 1

## 2013-02-27 MED ORDER — KETOROLAC TROMETHAMINE 60 MG/2ML IM SOLN
INTRAMUSCULAR | Status: AC
  Administered 2013-02-27: 60 mg via INTRAMUSCULAR
  Filled 2013-02-27: qty 2

## 2013-02-27 MED ORDER — DIPHENHYDRAMINE HCL 50 MG/ML IJ SOLN: 25.0000 mg | INTRAMUSCULAR | Status: DC | PRN

## 2013-02-27 MED ORDER — LACTATED RINGERS IV SOLN
INTRAVENOUS | Status: DC
  Administered 2013-02-27 (×2): via INTRAVENOUS

## 2013-02-27 MED ORDER — ONDANSETRON HCL 4 MG PO TABS: 4.0000 mg | ORAL_TABLET | ORAL | Status: DC | PRN

## 2013-02-27 MED ORDER — LANOLIN HYDROUS EX OINT: 1.0000 "application " | TOPICAL_OINTMENT | CUTANEOUS | Status: DC | PRN

## 2013-02-27 MED ORDER — NALOXONE HCL 1 MG/ML IJ SOLN
1.0000 ug/kg/h | INTRAVENOUS | Status: DC | PRN
  Filled 2013-02-27: qty 2

## 2013-02-27 MED ORDER — MEPERIDINE HCL 25 MG/ML IJ SOLN
INTRAMUSCULAR | Status: AC
  Filled 2013-02-27: qty 1

## 2013-02-27 MED ORDER — SODIUM CHLORIDE 0.9 % IJ SOLN
3.0000 mL | INTRAMUSCULAR | Status: DC | PRN
  Administered 2013-02-27: 3 mL via INTRAVENOUS

## 2013-02-27 MED ORDER — OXYCODONE-ACETAMINOPHEN 5-325 MG PO TABS
1.0000 | ORAL_TABLET | ORAL | Status: DC | PRN
  Administered 2013-02-27: 1 via ORAL
  Administered 2013-02-28 – 2013-03-01 (×5): 2 via ORAL
  Administered 2013-03-02 (×2): 1 via ORAL
  Administered 2013-03-02: 2 via ORAL
  Filled 2013-02-27 (×3): qty 2
  Filled 2013-02-27: qty 1
  Filled 2013-02-27 (×3): qty 2
  Filled 2013-02-27 (×2): qty 1
  Filled 2013-02-27: qty 2

## 2013-02-27 MED ORDER — SIMETHICONE 80 MG PO CHEW
80.0000 mg | CHEWABLE_TABLET | Freq: Three times a day (TID) | ORAL | Status: DC
  Administered 2013-02-27 – 2013-03-02 (×11): 80 mg via ORAL

## 2013-02-27 MED ORDER — FENTANYL CITRATE 0.05 MG/ML IJ SOLN
INTRAMUSCULAR | Status: AC
  Administered 2013-02-27: 50 ug via INTRAVENOUS
  Filled 2013-02-27: qty 2

## 2013-02-27 MED ORDER — WITCH HAZEL-GLYCERIN EX PADS: 1.0000 "application " | MEDICATED_PAD | CUTANEOUS | Status: DC | PRN

## 2013-02-27 MED ORDER — HYDROMORPHONE HCL PF 1 MG/ML IJ SOLN: 1.0000 mg | INTRAMUSCULAR | Status: DC | PRN

## 2013-02-27 MED ORDER — SCOPOLAMINE 1 MG/3DAYS TD PT72
MEDICATED_PATCH | TRANSDERMAL | Status: AC
  Filled 2013-02-27: qty 1

## 2013-02-27 MED ORDER — DIBUCAINE 1 % RE OINT: 1.0000 "application " | TOPICAL_OINTMENT | RECTAL | Status: DC | PRN

## 2013-02-27 MED ORDER — DIPHENHYDRAMINE HCL 50 MG/ML IJ SOLN: 12.5000 mg | INTRAMUSCULAR | Status: DC | PRN

## 2013-02-27 MED ORDER — HYDROMORPHONE HCL PF 1 MG/ML IJ SOLN
INTRAMUSCULAR | Status: AC
  Administered 2013-02-27: 1 mg
  Filled 2013-02-27: qty 1

## 2013-02-27 MED ORDER — ACETAMINOPHEN 500 MG PO TABS
1000.0000 mg | ORAL_TABLET | Freq: Once | ORAL | Status: AC
  Administered 2013-02-27: 1000 mg via ORAL
  Filled 2013-02-27: qty 2

## 2013-02-27 MED ORDER — METOCLOPRAMIDE HCL 5 MG/ML IJ SOLN: 10.0000 mg | Freq: Three times a day (TID) | INTRAMUSCULAR | Status: DC | PRN

## 2013-02-27 MED ORDER — DIPHENHYDRAMINE HCL 25 MG PO CAPS: 25.0000 mg | ORAL_CAPSULE | ORAL | Status: DC | PRN

## 2013-02-27 MED ORDER — ZOLPIDEM TARTRATE 5 MG PO TABS: 5.0000 mg | ORAL_TABLET | Freq: Every evening | ORAL | Status: DC | PRN

## 2013-02-27 MED ORDER — IBUPROFEN 600 MG PO TABS
600.0000 mg | ORAL_TABLET | Freq: Four times a day (QID) | ORAL | Status: DC
  Administered 2013-02-27 – 2013-03-02 (×14): 600 mg via ORAL
  Filled 2013-02-27 (×15): qty 1

## 2013-02-27 MED ORDER — NALOXONE HCL 0.4 MG/ML IJ SOLN: 0.4000 mg | INTRAMUSCULAR | Status: DC | PRN

## 2013-02-27 MED ORDER — HYDROMORPHONE HCL PF 1 MG/ML IJ SOLN: 1.0000 mg | Freq: Once | INTRAMUSCULAR | Status: AC

## 2013-02-27 MED ORDER — SENNOSIDES-DOCUSATE SODIUM 8.6-50 MG PO TABS
2.0000 | ORAL_TABLET | Freq: Every day | ORAL | Status: DC
  Administered 2013-02-27 – 2013-03-01 (×3): 2 via ORAL

## 2013-02-27 MED ORDER — TETANUS-DIPHTH-ACELL PERTUSSIS 5-2.5-18.5 LF-MCG/0.5 IM SUSP
0.5000 mL | Freq: Once | INTRAMUSCULAR | Status: AC
  Administered 2013-02-27: 0.5 mL via INTRAMUSCULAR
  Filled 2013-02-27: qty 0.5

## 2013-02-27 MED ORDER — ONDANSETRON HCL 4 MG/2ML IJ SOLN: 4.0000 mg | Freq: Three times a day (TID) | INTRAMUSCULAR | Status: DC | PRN

## 2013-02-27 MED ORDER — OXYTOCIN 40 UNITS IN LACTATED RINGERS INFUSION - SIMPLE MED: 62.5000 mL/h | INTRAVENOUS | Status: AC

## 2013-02-27 MED ORDER — PRENATAL MULTIVITAMIN CH
1.0000 | ORAL_TABLET | Freq: Every day | ORAL | Status: DC
  Administered 2013-02-27 – 2013-03-02 (×4): 1 via ORAL
  Filled 2013-02-27 (×4): qty 1

## 2013-02-27 MED ORDER — DIPHENHYDRAMINE HCL 25 MG PO CAPS: 25.0000 mg | ORAL_CAPSULE | Freq: Four times a day (QID) | ORAL | Status: DC | PRN

## 2013-02-27 MED ORDER — ONDANSETRON HCL 4 MG/2ML IJ SOLN: 4.0000 mg | INTRAMUSCULAR | Status: DC | PRN

## 2013-02-27 NOTE — Op Note (Signed)
NAME:  OSCEOLA, HOLIAN                ACCOUNT NO.:  [id number]  MEDICAL RECORD NO.:  [id number]  LOCATION:  9104                          FACILITY:  [facility]  PHYSICIAN:  Philip Aspen, DO    DATE OF BIRTH:  1981/03/24  DATE OF PROCEDURE:  02/26/2013 DATE OF DISCHARGE:                              OPERATIVE REPORT   PREOPERATIVE DIAGNOSES:  Term pregnancy with desired repeat C-section and ruptured membranes.  POSTOPERATIVE DIAGNOSES:  Term pregnancy, with desired repeat C-section, and ruptured membranes.  PROCEDURE:  Low-transverse cesarean section.  SURGEON:  Philip Aspen, DO  ANESTHESIA:  Spinal.  FLUIDS:  2200 mL.  ESTIMATED BLOOD LOSS:  600 mL.  URINE OUTPUT:  300 mL.  FINDINGS:  Female infant in cephalic presentation with Apgars 9 and 9. Normal tubes and ovaries bilaterally.  No significant adhesions noted.  SPECIMENS:  Cord blood.  COMPLICATIONS:  None.  CONDITION:  Stable to PACU.  DESCRIPTION OF PROCEDURE:  The patient ruptured around 3:30 p.m. just after eating 2 slices of pizza.  Anesthesia was consulted and they requested if the patient was not actively laboring to hold surgery until 8 hours after last ingestion of food.  The patient was transferred to L and D where she was monitored.  She became more uncomfortable around 10 p.m., was rechecked and cervix was found to have changed from 1 cm to 2- 3 cm and 90% effaced.  Due to her discomfort, the patient was reconsulted, and Dr. Arby Barrette felt comfortable proceeding with the repeat cesarean section.  The patient was consented.  All questions were answered and she was brought to the operating room where spinal anesthesia was administered and found to be adequate.  She was then prepped and draped in a normal sterile fashion and a dorsal supine position with a leftward tilt.  The scalpel was used to create a Pfannenstiel skin incision, which was carried down to the underlying layer of fascia with the Bovie.   Fascia was incised and extended laterally with Mayo scissors.  Kocher clamps were placed to the superior aspect of the fascial incision, was tented and rectus muscles were dissected off bluntly and sharply.  Kocher clamps were then placed in the inferior aspect and the fascia was again tented and rectus and pyramidalis muscles were dissected off bluntly and sharply.  Hemostat was used to separate rectus muscles and preperitoneal fat was bluntly dissected.  Hemostats were used to tent peritoneum which showed no evidence of any bowel.  It was entered sharply and extended by manual traction laterally.  The abdomen was explored and no abnormal findings were noted.  The Alexis self retractor was placed.  Bladder flap was created using Metzenbaum scissors and digital development.  A scalpel was used to incise the lower uterine segment of the uterus and the amniotic sac was entered sharply.  The incision was extended by caudal and cephalic traction.  The infant's head was elevated and delivered without difficulty followed by shoulders and remainder of the body.  The cord was clamped doubly and cut, and the infant was handed off to awaiting neonatology.  Cord blood was collected.  Pitocin was started and manual massage on the  external surface of the uterus was performed until it became firm and the placenta delivered.  All clots and debris were removed from the uterus and the uterus was closed in 2 layers with 0 Vicryl, first in a running locked fashion and followed by horizontal Lembert imbrication.  Excellent hemostasis was noted.  Any remaining clot and fluid was removed from the gutters.  The incision was again examined and found to be hemostatic.  The Alexis self retractor was removed.  Kelly clamps were used to grasp the peritoneum, which was closed with Monocryl in a running fashion, and 3 additional continuous sutures were run cephalically to reapproximate the rectus muscles.  No bleeding  points were noted.  The fascia was then reapproximated and closed with Vicryl in a running fashion.  The subcutaneous tissue was irrigated, dried, and minimal use of cautery was used to achieve hemostasis.  The skin was reapproximated and closed with staples.  The patient tolerated the procedure well.  Sponge, lap, and needle counts were correct x2.  The patient was taken to recovery in stable condition.          ______________________________ Philip Aspen, DO     [place]/MEDQ  D:  02/27/2013  T:  02/27/2013  Job:  [id number]

## 2013-02-27 NOTE — Anesthesia Postprocedure Evaluation (Signed)
Anesthesia Post Note  Patient: Audrey Edwards  Procedure(s) Performed: Procedure(s) (LRB): CESAREAN SECTION (N/A)  Anesthesia type: Spinal  Patient location: PACU  Post pain: Pain level controlled  Post assessment: Post-op Vital signs reviewed  Last Vitals:  Filed Vitals:   02/26/13 2350  BP:   Pulse:   Temp: 36.5 C  Resp:     Post vital signs: Reviewed  Level of consciousness: awake  Complications: No apparent anesthesia complications

## 2013-02-27 NOTE — Anesthesia Postprocedure Evaluation (Signed)
  Anesthesia Post-op Note  Patient: Audrey Edwards  Procedure(s) Performed: Procedure(s): CESAREAN SECTION (N/A)  Patient Location: Mother/Baby  Anesthesia Type:Regional  Level of Consciousness: awake  Airway and Oxygen Therapy: Patient Spontanous Breathing  Post-op Pain: none  Post-op Assessment: Patient's Cardiovascular Status Stable, Respiratory Function Stable, Patent Airway, No signs of Nausea or vomiting, Adequate PO intake, Pain level controlled, No headache, No backache, No residual numbness and No residual motor weakness  Post-op Vital Signs: Reviewed and stable  Complications: No apparent anesthesia complications

## 2013-02-27 NOTE — Progress Notes (Signed)
POD#1 Pt doing well. Lochia-wnl. VSSAF IMP/ stable PLAN/ routine care.

## 2013-02-27 NOTE — Progress Notes (Signed)
UR chart review completed.  

## 2013-02-28 ENCOUNTER — Encounter (HOSPITAL_COMMUNITY): Payer: Self-pay | Admitting: Obstetrics and Gynecology

## 2013-02-28 MED ORDER — PNEUMOCOCCAL VAC POLYVALENT 25 MCG/0.5ML IJ INJ
0.5000 mL | INJECTION | INTRAMUSCULAR | Status: DC
  Filled 2013-02-28: qty 0.5

## 2013-02-28 MED ORDER — PNEUMOCOCCAL VAC POLYVALENT 25 MCG/0.5ML IJ INJ: 0.5000 mL | INJECTION | INTRAMUSCULAR | Status: DC

## 2013-02-28 NOTE — Progress Notes (Signed)
Subjective: Postpartum Day 2: Cesarean Delivery Patient reports incisional pain.    Objective: Vital signs in last 24 hours: Temp:  [97.4 F (36.3 C)-99.1 F (37.3 C)] 97.4 F (36.3 C) (07/04 0515) Pulse Rate:  [74-92] 74 (07/04 0515) Resp:  [18-20] 18 (07/04 0515) BP: (117-135)/(71-78) 117/74 mmHg (07/04 0515) SpO2:  [96 %-98 %] 97 % (07/03 2100)  Physical Exam:  General: alert, cooperative and appears stated age Lochia: appropriate Uterine Fundus: firm Incision: healing well DVT Evaluation: No evidence of DVT seen on physical exam.   Recent Labs  02/26/13 1810 02/27/13 0600  HGB 10.7* 9.2*  HCT 33.6* 29.1*    Assessment/Plan: Status post Cesarean section. Doing well postoperatively.  Continue current care.  Tonianne Fine H. 02/28/2013, 11:25 AM

## 2013-02-28 NOTE — Clinical Social Work Maternal (Signed)
    Clinical Social Work Department PSYCHOSOCIAL ASSESSMENT - MATERNAL/CHILD 02/28/2013  Patient:  Audrey Edwards, Audrey Edwards  Account Number:  [id number]  Admit Date:  02/26/2013  Marjo Bicker Name:   Bunnie Pion    Clinical Social Worker:  Nobie Putnam, LCSW   Date/Time:  02/28/2013 10:02 AM  Date Referred:  02/28/2013   Referral source  CN     Referred reason  Depression/Anxiety  Substance Abuse   Other referral source:    I:  FAMILY / HOME ENVIRONMENT Child's legal guardian:  PARENT  Guardian - Name Guardian - Age Guardian - Address  Haani Bakula [address] [place], [place] [address]  Terrace Arabia 35 (same as above)   Other household support members/support persons Name Relationship DOB   SON 2007   Other support:   Family    II  PSYCHOSOCIAL DATA Information Source:  Patient Interview  Event organiser Employment:   Surveyor, quantity resources:  [employer] If Medicaid - County:  [place] Other  Sales executive  WIC   School / Grade:   Maternity Care Coordinator / Child Services Coordination / Early Interventions:  Cultural issues impacting care:    III  STRENGTHS Strengths  Adequate Resources  Home prepared for Child (including basic supplies)  Supportive family/friends   Strength comment:    IV  RISK FACTORS AND CURRENT PROBLEMS Current Problem:  YES   Risk Factor & Current Problem Patient Issue Family Issue Risk Factor / Current Problem Comment  Substance Abuse Y N Hx of opioid abuse  Mental Illness Y N Hx of depression/anxiety    V  SOCIAL WORK ASSESSMENT CSW met with pt to assess her current social situation & offer resources if needed.  Pt started taking Percocet in April '13 & June '13, after bunionectomy surgeries.  Pt told CSW that she has continued to take the Percocet 5mg , since then at least "once every other day."  Since pt did not have a doctor that would prescribe more medication, she admits to purchasing pills off the street.   Pt does know think she is addicted to the pills but told CSW that if she doesn't have any, the pain in her back & shoulder hurts worse.  She denies other illegal substance use & verbalized understanding of hospital drug testing policy.  UDS is negative, meconium results are pending.  Pt seemed concerned about possible CPS involvement.   During the last trimester, pt felt depressed & was prescribed Prozac.  She did not take the medication due to unknown side effects to fetus.  She denies any depression now or SI history.  She has all the necessary supplies for the infant.  FOB at the bedside & supportive. He is not aware of pt's regular opiate use & she does not want him to know.  Pt is aware that the infant may experience withdrawal symptoms, which will require the infant stay longer for observation.  CSW will monitor drug screen results & make a referral if needed.      VI SOCIAL WORK PLAN Social Work Plan  No Further Intervention Required / No Barriers to Discharge   Type of pt/family education:   If child protective services report - county:   If child protective services report - date:   Information/referral to community resources comment:   Other social work plan:

## 2013-03-01 LAB — TYPE AND SCREEN: Unit division: 0

## 2013-03-01 NOTE — Discharge Summary (Addendum)
Obstetric Discharge Summary Date of Discharge 03/02/2013 Reason for Admission: onset of labor Prenatal Procedures: ultrasound Intrapartum Procedures: cesarean: low cervical, transverse Postpartum Procedures: none Complications-Operative and Postpartum: none Hemoglobin  Date Value Range Status  02/27/2013 9.2* 12.0 - 15.0 g/dL Final     HCT  Date Value Range Status  02/27/2013 29.1* 36.0 - 46.0 % Final    Physical Exam:  General: alert, cooperative and appears stated age 32: appropriate Uterine Fundus: firm Incision: healing well DVT Evaluation: No evidence of DVT seen on physical exam.  Discharge Diagnoses: Term Pregnancy-delivered  Discharge Information: Date: 03/01/2013 Activity: pelvic rest Diet: routine Medications: Ibuprofen, Colace and Percocet Condition: improved Instructions: refer to practice specific booklet Discharge to: home Follow-up Information   Follow up with CALLAHAN, SIDNEY, DO In 4 weeks. (For apostpartum evaluation)    Contact information:   [address] [place] [address] [phone]       Newborn Data: Live born female  Birth Weight: 7 lb 14.8 oz (3595 g) APGAR: 9, 9  Home with mother.  Audrey Edwards H. 03/01/2013, 10:32 AM

## 2013-03-01 NOTE — Progress Notes (Signed)
Subjective: Postpartum Day 3: Cesarean Delivery Patient reports incisional pain.    Objective: Vital signs in last 24 hours: Temp:  [97.8 F (36.6 C)-98.3 F (36.8 C)] 97.8 F (36.6 C) (07/05 0600) Pulse Rate:  [77-85] 77 (07/05 0600) Resp:  [18] 18 (07/05 0600) BP: (121-127)/(72-73) 121/72 mmHg (07/05 0600) SpO2:  [96 %] 96 % (07/04 1734)  Physical Exam:  General: alert, cooperative and appears stated age Lochia: appropriate Uterine Fundus: firm Incision: healing well DVT Evaluation: No evidence of DVT seen on physical exam.   Recent Labs  02/26/13 1810 02/27/13 0600  HGB 10.7* 9.2*  HCT 33.6* 29.1*    Assessment/Plan: Status post Cesarean section. Doing well postoperatively.  Continue current care. Baby's withdrawal score was 7, but per peds the baby is actually doing much better, feeding better, and has not lost as much weight as expected.  Peds plans to monitor one more day.  FOB is unaware of pateint's percocet use.  Anticipate DC tomorrow Audrey Edwards H. 03/01/2013, 11:10 AM

## 2013-03-02 MED ORDER — IBUPROFEN 600 MG PO TABS: 600.0000 mg | ORAL_TABLET | Freq: Four times a day (QID) | ORAL | Status: DC | PRN

## 2013-03-02 MED ORDER — OXYCODONE-ACETAMINOPHEN 5-325 MG PO TABS: 2.0000 | ORAL_TABLET | ORAL | Status: DC | PRN

## 2013-03-02 MED ORDER — DOCUSATE SODIUM 100 MG PO CAPS: 100.0000 mg | ORAL_CAPSULE | Freq: Two times a day (BID) | ORAL | Status: DC

## 2013-03-02 NOTE — Lactation Note (Addendum)
This note was copied from the chart of Audrey Edwards. Lactation Consultation Note  Patient Name: Audrey Edwards WGNFA'O Date: 03/02/2013 Reason for consult: Follow-up assessment   Maternal Data    Feeding Feeding Type: Breast Milk Feeding method: Breast Length of feed: 30 min  LATCH Score/Interventions Latch: Grasps breast easily, tongue down, lips flanged, rhythmical sucking.  Audible Swallowing: A few with stimulation  Type of Nipple: Everted at rest and after stimulation Intervention(s): Double electric pump  Comfort (Breast/Nipple): Filling, red/small blisters or bruises, mild/mod discomfort  Problem noted: Mild/Moderate discomfort Interventions (Mild/moderate discomfort): Comfort gels  Hold (Positioning): Assistance needed to correctly position infant at breast and maintain latch. Intervention(s): Breastfeeding basics reviewed;Support Pillows  LATCH Score: 7  Lactation Tools Discussed/Used Tools: Nipple Shields;59F feeding tube / Syringe Nipple shield size: 24 Breast pump type: Double-Electric Breast Pump   Consult Status Consult Status: Follow-up Date: 03/02/13 Follow-up type: In-patient  Assisted mom with feeding with NS and feeding tube. Mom has pumped 15 cc's of whitish breast milk. Reports that breasts are feeling fuller this morning. Dad present and very helpful to mom in getting baby latched. Tried to latch Ava without NS and she latched for a few minutes but then got fussy and would not stay latched. Encouraged mom to continue trying to latch without NS. To continue same plan for now. No questions at present. To call prn. Has OP appointment with [place] on Wed 03/05/13 at 1 pm.  Audrey Edwards D 03/02/2013, 10:16 AM

## 2013-03-02 NOTE — Lactation Note (Signed)
This note was copied from the chart of Audrey Edwards. Lactation Consultation Note  Patient Name: Audrey Dymphna Wadley ZOXWR'U Date: 03/02/2013     Maternal Data    Feeding   LATCH Score/Interventions                      Lactation Tools Discussed/Used     Consult Status    Mom has been giving bottles of EBM for the last 2 feedings.. WIC loaner pump completed. No questions at present. To call prn.  Pamelia Hoit 03/02/2013, 3:57 PM

## 2013-03-03 ENCOUNTER — Inpatient Hospital Stay (HOSPITAL_COMMUNITY): Admission: RE | Admit: 2013-03-03 | Payer: Medicaid Other | Source: Ambulatory Visit

## 2013-03-05 ENCOUNTER — Ambulatory Visit (HOSPITAL_COMMUNITY)
Admit: 2013-03-05 | Discharge: 2013-03-05 | Disposition: A | Discharge status: Home / Self Care | Payer: Medicaid Other | Attending: Obstetrics and Gynecology | Admitting: Obstetrics and Gynecology

## 2013-03-05 ENCOUNTER — Encounter (HOSPITAL_COMMUNITY): Admission: AD | Payer: Self-pay | Source: Ambulatory Visit

## 2013-03-05 ENCOUNTER — Inpatient Hospital Stay (HOSPITAL_COMMUNITY)
Admission: AD | Admit: 2013-03-05 | Payer: Medicaid Other | Source: Ambulatory Visit | Admitting: Obstetrics and Gynecology

## 2013-03-05 SURGERY — Surgical Case
Anesthesia: Regional

## 2013-03-05 NOTE — Lactation Note (Signed)
Adult Lactation Consultation Outpatient Visit Note  Patient Name: KESS MCILWAIN  ZOXW'R name: Ava Milliner Date of Birth: 06-21-1981   DOB: [birth year] Gestational Age at Delivery: [redacted]w[redacted]d  BW: 7# 14.8oz (3595g) Type of Delivery: C/S    Today's weight: 7# 4.7oz (3308g; 8% below BW)  Breastfeeding History: Frequency of Breastfeeding: q3hrs  Length of Feeding: on each side Voids: light yellow (6/day) Stools: yellow, seedy (6/day) (yellow-brown seen during consult)  Supplementing / Method: Pumping:  Type of Pump: Symphony   Frequency: q2 hrs X  Volume: 40mL total  Comments: Baby has received 4-5 bottles of 35-73mL of EBM.       Consultation Evaluation:  Initial Feeding Assessment: Pre-feed Weight:3308g Post-feed [id number] Amount Transferred:54mL Comments:R breast, nipple shield (size 24), 20 min  Additional Feeding Assessment: Pre-feed Weight:3326g Post-feed [id number] Amount Transferred:40mL Comments: L breast, nipple shield (size 20), 20 min ; Mom reports a stronger pull  Additional Feeding Assessment: Pre-feed Weight:3340g Post-feed [id number] Amount Transferred:105mL Comments:R breast, nipple shield (size 24, which is the better size for the R breast).   Total Breast milk Transferred this Visit: 46mL Formula given:20mL   Additional Interventions:   Follow-Up Baby down 8% from BW at 1 week of age.  After spending a total of 60 min at breast, baby only transferred a total of 46mL. Although Mom's breasts felt fuller to her a couple of days ago, her breasts still feel somewhat soft.  It is possible that Mom's breast milk has not completely come to volume, yet.  Mom encouraged to nurse baby, pump, and to supplement baby with a bottle.  Parents encouraged not to restrict baby's feeding amounts. Mom understands that formula will need to be used if she is unable to pump enough to satisfy baby's needs.  Baby needs about 19-24 oz/day to gain weight.  Slow-paced  bottle feeding was taught to parents.   Mom may also try Fenugreek to see if that would increase her supply.                 Lurline Hare [facility] 03/05/2013, 1:17 PM

## 2013-08-08 ENCOUNTER — Encounter: Payer: Self-pay | Admitting: Podiatrist

## 2013-08-08 ENCOUNTER — Ambulatory Visit (INDEPENDENT_AMBULATORY_CARE_PROVIDER_SITE_OTHER): Payer: Medicaid Other

## 2013-08-08 ENCOUNTER — Ambulatory Visit (INDEPENDENT_AMBULATORY_CARE_PROVIDER_SITE_OTHER): Payer: Medicaid Other | Admitting: Podiatrist

## 2013-08-08 ENCOUNTER — Ambulatory Visit: Payer: Self-pay | Admitting: Podiatrist

## 2013-08-08 VITALS — BP 115/76 | HR 85 | Resp 20 | Ht 61.0 in | Wt 135.0 lb

## 2013-08-08 DIAGNOSIS — M7660 Achilles tendinitis, unspecified leg: Secondary | ICD-10-CM

## 2013-08-08 DIAGNOSIS — S93409A Sprain of unspecified ligament of unspecified ankle, initial encounter: Secondary | ICD-10-CM

## 2013-08-08 DIAGNOSIS — R52 Pain, unspecified: Secondary | ICD-10-CM

## 2013-08-08 DIAGNOSIS — T85848A Pain due to other internal prosthetic devices, implants and grafts, initial encounter: Secondary | ICD-10-CM

## 2013-08-08 DIAGNOSIS — Z967 Presence of other bone and tendon implants: Secondary | ICD-10-CM

## 2013-08-08 MED ORDER — OXYCODONE-ACETAMINOPHEN 5-325 MG PO TABS: 1.0000 | ORAL_TABLET | Freq: Four times a day (QID) | ORAL | Status: DC | PRN

## 2013-08-08 NOTE — Patient Instructions (Signed)
Pre-Operative Instructions  Congratulations, you have decided to take an important step to improving your quality of life.  You can be assured that the doctors of [facility] will be with you every step of the way.  1. Plan to be at the surgery center/hospital at least 1 (one) hour prior to your scheduled time unless otherwise directed by the surgical center/hospital staff.  You must have a responsible adult accompany you, remain during the surgery and drive you home.  Make sure you have directions to the surgical center/hospital and know how to get there on time. 2. For hospital based surgery you will need to obtain a history and physical form from your family physician within 1 month prior to the date of surgery- we will give you a form for you primary physician.  3. We make every effort to accommodate the date you request for surgery.  There are however, times where surgery dates or times have to be moved.  We will contact you as soon as possible if a change in schedule is required.   4. No Aspirin/Ibuprofen for one week before surgery.  If you are on aspirin, any non-steroidal anti-inflammatory medications (Mobic, Aleve, Ibuprofen) you should stop taking it 7 days prior to your surgery.  You make take Tylenol  For pain prior to surgery.  5. Medications- If you are taking daily heart and blood pressure medications, seizure, reflux, allergy, asthma, anxiety, pain or diabetes medications, make sure the surgery center/hospital is aware before the day of surgery so they may notify you which medications to take or avoid the day of surgery. 6. No food or drink after midnight the night before surgery unless directed otherwise by surgical center/hospital staff. 7. No alcoholic beverages 24 hours prior to surgery.  No smoking 24 hours prior to or 24 hours after surgery. 8. Wear loose pants or shorts- loose enough to fit over bandages, boots, and casts. 9. No slip on shoes, sneakers are best. 10. Bring  your boot with you to the surgery center/hospital.  Also bring crutches or a walker if your physician has prescribed it for you.  If you do not have this equipment, it will be provided for you after surgery. 11. If you have not been contracted by the surgery center/hospital by the day before your surgery, call to confirm the date and time of your surgery. 12. Leave-time from work may vary depending on the type of surgery you have.  Appropriate arrangements should be made prior to surgery with your employer. 13. Prescriptions will be provided immediately following surgery by your doctor.  Have these filled as soon as possible after surgery and take the medication as directed. 14. Remove nail polish on the operative foot. 15. Wash the night before surgery.  The night before surgery wash the foot and leg well with the antibacterial soap provided and water paying special attention to beneath the toenails and in between the toes.  Rinse thoroughly with water and dry well with a towel.  Perform this wash unless told not to do so by your physician.  Enclosed: 1 Ice pack (please put in freezer the night before surgery)   1 Hibiclens skin cleaner   Pre-op Instructions  If you have any questions regarding the instructions, do not hesitate to call our office.  [place]: [address] [place], [place] [address] [phone]  [place]: [address] [place], [place] [address] [phone]  [place]: [address]  [place], [place] [address] [phone]  Dr. Richard   Tuchman DPM, Dr. Norman Regal DPM Dr. Richard Sikora DPM, Dr. M. Todd Hyatt DPM, Dr. Corazon Nickolas DPM 

## 2013-08-08 NOTE — Progress Notes (Signed)
Subjective: Audrey Edwards presents today for pain bilateral feet and left ankle injury. She previously had bunion surgery performed by Dr. Wynelle Cleveland in the summer of 2013.  She states her feet hurt now and she's unable to wear an enclosed shoe. She presents today in a very worn out flip-flop sandals that she states is the only thing she can comfortably wear. She also states she has recurrent ankle sprains and ankle injuries which almost caused her her job. She is a Education administrator. She states she's had about 8 ankle sprains and now her ankle will just go out from under her without warning. She also relates she was prescribed Percocet and that this helps with the pain.  Objective: Neurovascular status is intact to bilateral feet with palpable, pedal pulses and neurological sensation intact both epicriticaly and protectively. She has pain along the medial aspect of the first metatarsal left where there is a K wire placed from surgery. No pain on the dorsal aspect of the first metatarsal is noted. She also has pinpoint pain at the first metatarsophalangeal joint right and describes the pain as severe. She also has pain along the lateral ankle both anteriorly and laterally with pressure and with ambulation  Assessment: #1 painful retained hardware left bunion area #2 bursitis capsulitis first metatarsophalangeal joint right #3 chronic ankle sprain left with ankle ligamentous injury most likely  Plan: Discussed that I could remove the pin of the left bunion and injected steroid in the right bunion. I also discussed I can get an MRI on her left ankle however this would be in preparation for surgical planning. At this time the patient would like to have the pin removed and steroid injection first and from there with the ankle. Clinically there is nothing I can find wrong with the right first metatarsophalangeal joint and there's excellent correction from the preoperative x-rays compared to today's x-rays. Judging from the appearance  of her flip-flops it is very likely that she has worn these consistently for a year and this is most likely the causative factor for the pain at the great toe joint right foot. The patient appears hyperreactive to pain and discomfort and she does ask for preoperative pain medication as the last time she had surgery it took for 3 days to get her pain medication after surgery due to insurance issues. I did write her for 30 Percocet and told her to use these sparingly as I will only give her one pain medication prescription after surgery and I will not give her any more.  Marlowe Aschoff DPM

## 2013-08-26 ENCOUNTER — Telehealth: Payer: Self-pay | Admitting: *Deleted

## 2013-08-26 NOTE — Telephone Encounter (Addendum)
Pt states she has finished the Oxycodone and wanted to know what she was to do until her surgery 09/03/2013 and after.  I will advise DR Irving Shows and call pt.  Dr Irving Shows ordered oxycodone #30 one tablet every 8 hours.  I left a message [phone], to pick the rx up at the [facility] office.

## 2013-08-27 ENCOUNTER — Telehealth: Payer: Self-pay | Admitting: *Deleted

## 2013-08-27 MED ORDER — OXYCODONE-ACETAMINOPHEN 5-325 MG PO TABS: 1.0000 | ORAL_TABLET | Freq: Three times a day (TID) | ORAL | Status: DC | PRN

## 2013-08-27 NOTE — Telephone Encounter (Signed)
rx for Percocet ordered.

## 2013-08-27 NOTE — Telephone Encounter (Signed)
Pt states she lost her pre-op kit, can she pick up the kit on 08/29/2013.  I told pt yes.

## 2013-08-29 ENCOUNTER — Other Ambulatory Visit: Payer: Self-pay | Admitting: *Deleted

## 2013-08-29 DIAGNOSIS — M25572 Pain in left ankle and joints of left foot: Secondary | ICD-10-CM

## 2013-09-01 ENCOUNTER — Telehealth: Payer: Self-pay | Admitting: *Deleted

## 2013-09-01 NOTE — Telephone Encounter (Addendum)
Unable to contact pt to confirm her surgery of 08/29/2013.  Spoke with pt and she has decided to cancel 01/07/205 surgery, go with the referral to Dr Victorino DikeHewitt, if Dr Irving ShowsEgerton can work with her and prescribe pain medication until the referral appt.  I will advise Dr Irving ShowsEgerton and call pt with referral appt and medication answer.  Pt's signed consent for 09/03/2012 surgery fro procedures - left foot Removal of painful hardware (bunion), Steroid injection Right First Metatarsal phalangeal joint.

## 2013-09-01 NOTE — Telephone Encounter (Signed)
Patient wants to proceed with referral to [facility], but concerned about how long it will take to get an appointment. Requesting refill on pain meds until appointment with Dr Victorino DikeHewitt.

## 2013-09-03 ENCOUNTER — Encounter: Payer: Self-pay | Admitting: Podiatrist

## 2013-09-04 ENCOUNTER — Telehealth: Payer: Self-pay | Admitting: *Deleted

## 2013-09-04 NOTE — Telephone Encounter (Signed)
Receptionist states Dr Victorino DikeHewitt will review pt's chart and decide if will add another Medicaid pt to his schedule.  Faxed to DR Victorino DikeHewitt [phone].

## 2013-09-08 ENCOUNTER — Telehealth: Payer: Self-pay | Admitting: *Deleted

## 2013-09-08 NOTE — Telephone Encounter (Signed)
Pt request refill Oxycodone.  Dr Irving ShowsEgerton states pt has received 2 rx of Oxycodone prior to surgery and she is not going to refill, we will wait to see if Dr Victorino DikeHewitt accepts the case.  Pt agrees.

## 2013-09-10 ENCOUNTER — Encounter: Payer: Medicaid Other | Admitting: Podiatrist

## 2013-09-19 ENCOUNTER — Telehealth: Payer: Self-pay | Admitting: *Deleted

## 2013-09-19 NOTE — Telephone Encounter (Signed)
Pt states /dr Hewitt's office has not contacted her for an appt.  I called Dr Laverta [facility]Hewitt's [phone], spoke with referral agent Victorino Dike- Jennifer.  Victorino DikeJennifer states the pt is registered to be scheduled, but they were unable to contact with the phone number given.  I gave Victorino DikeJennifer the phone number the pt gave me today [phone].  Pt is scheduled with DR Victorino DikeHewitt for 10/20/2013 and Victorino DikeJennifer will contact pt.

## 2013-10-14 NOTE — Progress Notes (Signed)
1) Removal hardware left foot  2) Steroid injection 1st met right foot

## 2014-01-12 ENCOUNTER — Other Ambulatory Visit (HOSPITAL_BASED_OUTPATIENT_CLINIC_OR_DEPARTMENT_OTHER): Payer: Self-pay | Admitting: Physician Assistant

## 2014-01-12 DIAGNOSIS — IMO0002 Reserved for concepts with insufficient information to code with codable children: Secondary | ICD-10-CM

## 2014-02-10 ENCOUNTER — Ambulatory Visit (HOSPITAL_BASED_OUTPATIENT_CLINIC_OR_DEPARTMENT_OTHER)
Admission: RE | Admit: 2014-02-10 | Discharge: 2014-02-10 | Disposition: A | Discharge status: Home / Self Care | Payer: Medicaid Other | Source: Ambulatory Visit | Attending: Physician Assistant | Admitting: Physician Assistant

## 2014-02-10 DIAGNOSIS — M545 Low back pain, unspecified: Secondary | ICD-10-CM | POA: Diagnosis present

## 2014-02-10 DIAGNOSIS — M5126 Other intervertebral disc displacement, lumbar region: Secondary | ICD-10-CM | POA: Insufficient documentation

## 2014-02-10 DIAGNOSIS — IMO0002 Reserved for concepts with insufficient information to code with codable children: Secondary | ICD-10-CM

## 2014-06-12 ENCOUNTER — Emergency Department (HOSPITAL_COMMUNITY)
Admission: EM | Admit: 2014-06-12 | Discharge: 2014-06-12 | Disposition: A | Discharge status: Home / Self Care | Payer: Medicaid Other | Attending: Emergency Medicine | Admitting: Emergency Medicine

## 2014-06-12 ENCOUNTER — Encounter (HOSPITAL_COMMUNITY): Payer: Self-pay | Admitting: Emergency Medicine

## 2014-06-12 DIAGNOSIS — G8929 Other chronic pain: Secondary | ICD-10-CM | POA: Insufficient documentation

## 2014-06-12 DIAGNOSIS — Z720 Tobacco use: Secondary | ICD-10-CM | POA: Insufficient documentation

## 2014-06-12 DIAGNOSIS — Z3202 Encounter for pregnancy test, result negative: Secondary | ICD-10-CM | POA: Diagnosis not present

## 2014-06-12 DIAGNOSIS — E119 Type 2 diabetes mellitus without complications: Secondary | ICD-10-CM | POA: Diagnosis not present

## 2014-06-12 DIAGNOSIS — F1110 Opioid abuse, uncomplicated: Secondary | ICD-10-CM | POA: Diagnosis present

## 2014-06-12 DIAGNOSIS — F419 Anxiety disorder, unspecified: Secondary | ICD-10-CM | POA: Insufficient documentation

## 2014-06-12 DIAGNOSIS — R197 Diarrhea, unspecified: Secondary | ICD-10-CM | POA: Diagnosis not present

## 2014-06-12 DIAGNOSIS — M791 Myalgia: Secondary | ICD-10-CM | POA: Diagnosis not present

## 2014-06-12 DIAGNOSIS — Z79899 Other long term (current) drug therapy: Secondary | ICD-10-CM | POA: Diagnosis not present

## 2014-06-12 LAB — COMPREHENSIVE METABOLIC PANEL
ALBUMIN: 4.8 g/dL (ref 3.5–5.2)
ALT: 14 U/L (ref 0–35)
ANION GAP: 15 (ref 5–15)
AST: 16 U/L (ref 0–37)
Alkaline Phosphatase: 76 U/L (ref 39–117)
BUN: 14 mg/dL (ref 6–23)
CALCIUM: 9.8 mg/dL (ref 8.4–10.5)
CO2: 22 mEq/L (ref 19–32)
CREATININE: 0.63 mg/dL (ref 0.50–1.10)
Chloride: 100 mEq/L (ref 96–112)
GFR calc Af Amer: >90 mL/min (ref >90)
GFR calc non Af Amer: >90 mL/min (ref >90)
Glucose, Bld: 182 mg/dL — ABNORMAL HIGH (ref 70–99)
Potassium: 3.7 mEq/L (ref 3.7–5.3)
Sodium: 137 mEq/L (ref 137–147)
TOTAL PROTEIN: 8.1 g/dL (ref 6.0–8.3)
Total Bilirubin: 0.5 mg/dL (ref 0.3–1.2)

## 2014-06-12 LAB — CBC
HCT: 39 % (ref 36.0–46.0)
Hemoglobin: 12.2 g/dL (ref 12.0–15.0)
MCH: 20.4 pg — AB (ref 26.0–34.0)
MCHC: 31.3 g/dL (ref 30.0–36.0)
MCV: 65.3 fL — AB (ref 78.0–100.0)
PLATELETS: 253 10*3/uL (ref 150–400)
RBC: 5.97 MIL/uL — ABNORMAL HIGH (ref 3.87–5.11)
RDW: 16 % — AB (ref 11.5–15.5)
WBC: 7.2 10*3/uL (ref 4.0–10.5)

## 2014-06-12 LAB — RAPID URINE DRUG SCREEN, HOSP PERFORMED
Amphetamines: NOT DETECTED
BARBITURATES: NOT DETECTED
Benzodiazepines: NOT DETECTED
COCAINE: NOT DETECTED
OPIATES: POSITIVE — AB
Tetrahydrocannabinol: NOT DETECTED

## 2014-06-12 LAB — URINE MICROSCOPIC-ADD ON

## 2014-06-12 LAB — URINALYSIS, ROUTINE W REFLEX MICROSCOPIC
Glucose, UA: NEGATIVE mg/dL
Ketones, ur: >80 mg/dL — AB
Leukocytes, UA: NEGATIVE
NITRITE: NEGATIVE
PH: 6.5 (ref 5.0–8.0)
Protein, ur: NEGATIVE mg/dL
SPECIFIC GRAVITY, URINE: 1.03 (ref 1.005–1.030)
Urobilinogen, UA: 0.2 mg/dL (ref 0.0–1.0)

## 2014-06-12 LAB — POC URINE PREG, ED: Preg Test, Ur: NEGATIVE

## 2014-06-12 LAB — ACETAMINOPHEN LEVEL

## 2014-06-12 LAB — SALICYLATE LEVEL

## 2014-06-12 MED ORDER — LORAZEPAM 1 MG PO TABS
1.0000 mg | ORAL_TABLET | Freq: Once | ORAL | Status: AC
  Administered 2014-06-12: 1 mg via ORAL
  Filled 2014-06-12: qty 1

## 2014-06-12 MED ORDER — ONDANSETRON 4 MG PO TBDP
4.0000 mg | ORAL_TABLET | Freq: Once | ORAL | Status: AC
  Administered 2014-06-12: 4 mg via ORAL
  Filled 2014-06-12: qty 1

## 2014-06-12 MED ORDER — CLONIDINE HCL 0.1 MG PO TABS: 0.1000 mg | ORAL_TABLET | Freq: Once | ORAL | Status: DC

## 2014-06-12 MED ORDER — LORAZEPAM 1 MG PO TABS: 2.0000 mg | ORAL_TABLET | Freq: Once | ORAL | Status: DC

## 2014-06-12 NOTE — ED Notes (Signed)
Spoke with [place], pt good to go to [facility].

## 2014-06-12 NOTE — ED Provider Notes (Signed)
TIME SEEN: 9:36 AM  CHIEF COMPLAINT: Opiate detox  HPI: Patient is a 33 y.o. F with history of gestational diabetes, anxiety, opiate abuse who presents to the emergency department requesting opiate detox. She states that she has a history of chronic back pain and receives 60 Percocet tablets every 10 days from her PCP Johna SheriffMark Helper [facility] (confirmed on [place] controlled substance reporting system).  She states that she is also been abusing heroine, snorting it. No IV drug abuse. She has also been using Xanax intermittently but does not take this regularly. She last used heroin over 24 hours ago. She is having nausea, diarrhea and diffuse achy pain. She feels very anxious. She tells nursing staff that she is having thoughts of wanting to hurt herself but when I questioned her about this she is adamant that she would not hurt herself because she has kids. She states "I just feel so bad that I just can't keep going on like this". No history of prior suicide attempt. No HI or hallucinations. Reports occasional alcohol use.   ROS: See HPI Constitutional: no fever  Eyes: no drainage  ENT: no runny nose   Cardiovascular:  no chest pain  Resp: no SOB  GI: no vomiting GU: no dysuria Integumentary: no rash  Allergy: no hives  Musculoskeletal: no leg swelling  Neurological: no slurred speech ROS otherwise negative  PAST MEDICAL HISTORY/PAST SURGICAL HISTORY:  Past Medical History  Diagnosis Date  . Diabetes mellitus without complication   . Gestational diabetes   . Anxiety     MEDICATIONS:  Prior to Admission medications   Medication Sig Start Date End Date Taking? Authorizing Provider  ALPRAZolam Prudy Feeler(XANAX) 1 MG tablet Take 1 mg by mouth daily.    Historical Provider, MD  amphetamine-dextroamphetamine (ADDERALL XR) 30 MG 24 hr capsule Take 30 mg by mouth daily.    Historical Provider, MD  ibuprofen (ADVIL,MOTRIN) 600 MG tablet Take 1 tablet (600 mg total) by mouth every 6  (six) hours as needed for pain. 03/02/13   Kendra H. Tenny Crawoss, MD  Multiple Vitamin (MULTIVITAMIN) tablet Take 1 tablet by mouth daily.    Historical Provider, MD  oxyCODONE-acetaminophen (ROXICET) 5-325 MG per tablet Take 1-2 tablets by mouth every 6 (six) hours as needed. May take 1-2 tablets every 4-6 hours as needed for pain 08/08/13   Delories HeinzKathryn P Egerton, DPM  oxyCODONE-acetaminophen (ROXICET) 5-325 MG per tablet Take 1 tablet by mouth every 8 (eight) hours as needed for severe pain. 08/27/13   Delories HeinzKathryn P Egerton, DPM    ALLERGIES:  Allergies  Allergen Reactions  . Tramadol Itching and Nausea And Vomiting    SOCIAL HISTORY:  History  Substance Use Topics  . Smoking status: Current Some Day Smoker -- 0.30 packs/day    Types: Cigarettes  . Smokeless tobacco: Never Used  . Alcohol Use: No    FAMILY HISTORY: Family History  Problem Relation Age of Onset  . Obesity Other   . Stroke Other     EXAM: BP 109/62  Pulse 60  Temp(Src) 97.9 F (36.6 C)  Resp 20  SpO2 100% CONSTITUTIONAL: Alert and oriented and responds appropriately to questions. Well-appearing; well-nourished HEAD: Normocephalic EYES: Conjunctivae clear, PERRL ENT: normal nose; no rhinorrhea; moist mucous membranes; pharynx without lesions noted NECK: Supple, no meningismus, no LAD  CARD: RRR; S1 and S2 appreciated; no murmurs, no clicks, no rubs, no gallops RESP: Normal chest excursion without splinting or tachypnea; breath sounds clear and  equal bilaterally; no wheezes, no rhonchi, no rales,  ABD/GI: Normal bowel sounds; non-distended; soft, non-tender, no rebound, no guarding BACK:  The back appears normal and is non-tender to palpation, there is no CVA tenderness EXT: Normal ROM in all joints; non-tender to palpation; no edema; normal capillary refill; no cyanosis    SKIN: Normal color for age and race; warm NEURO: Moves all extremities equally; sensation to light touch intact diffusely, normal gait, cranial nerves  2-12 intact PSYCH: The patient's mood and manner are appropriate. Grooming and personal hygiene are appropriate. Denies SI, HI or hallucinations.  MEDICAL DECISION MAKING: Patient here requesting opiate detox. They have contacted [facility] and [facility] who instructed them to come to the emergency department for medical clearance. She does not have any current safety concerns. She does appear anxious and reports nausea. We'll give Zofran, Ativan. We'll obtain screening labs and urine. If normal, will discharge for followup at a rehabilitation facility. Have given her sister at bedside resource information and she will begin calling rehabilitation facilities.  ED PROGRESS: Patient's labs are unremarkable. Urine shows moderate hemoglobin but she is currently on her menstrual cycle. She has few bacteria but also squamous cells. No other sign of infection. She is currently medically stable and cleared. She reports that her sister was able to get in touch with a rehabilitation facility who agree to accept it her but they wanted us to fax her medical clearance. Patient is unable to tell me which treatment facility this wasn't states "I'm just ready to get out of here". She is here voluntarily. Discussed with patient that if she wants me to fax the paperwork I am happy to do so she is requesting discharge. I will prep her labs for her so she can take this to the rehabilitation facility. There is no current safety concerns. Discussed return precautions. She verbalizes understanding and is comfortable with plan.     Layla MawKristen N Ward, DO 06/12/14 1133

## 2014-06-12 NOTE — BH Assessment (Signed)
Per Audrey ReiningNicole, patient accepted to [facility]. Pt to be transported to [facility] via [facility].

## 2014-06-12 NOTE — ED Notes (Signed)
Per [facility], still waiting to hear from [facility].  Sts "they told me that they should be calling back in 30 mins."

## 2014-06-12 NOTE — Discharge Instructions (Signed)
[facility]. She is stable for detox or rehabilitation as an outpatient. We do not offer detox from opioids at behavioral health hospital at this time.   Opioid Withdrawal Opioids are a group of narcotic drugs. They include the street drug heroin. They also include pain medicines, such as morphine, hydrocodone, oxycodone, and fentanyl. Opioid withdrawal is a group of characteristic physical and mental signs and symptoms. It typically occurs if you have been using opioids daily for several weeks or longer and stop using or rapidly decrease use. Opioid withdrawal can also occur if you have used opioids daily for a long time and are given a medicine to block the effect.  SIGNS AND SYMPTOMS Opioid withdrawal includes three or more of the following symptoms:   Depressed, anxious, or irritable mood.  Nausea or vomiting.  Muscle aches or spasms.   Watery eyes.   Runny nose.  Dilated pupils, sweating, or hairs standing on end.  Diarrhea or intestinal cramping.  Yawning.   Fever.  Increased blood pressure.  Fast pulse.  Restlessness or trouble sleeping. These signs and symptoms occur within several hours of stopping or reducing short-acting opioids, such as heroin. They can occur within 3 days of stopping or reducing long-acting opioids, such as methadone. Withdrawal begins within minutes of receiving a drug that blocks the effects of opioids, such as naltrexone or naloxone. DIAGNOSIS  Opioid use disorder is diagnosed by your health care provider. You will be asked about your symptoms, drug and alcohol use, medical history, and use of medicines. A physical exam may be done. Lab tests may be ordered. Your health care provider may have you see a mental health professional.  TREATMENT  The treatment for opioid withdrawal is usually provided by medical doctors with special training in substance use disorders (addiction  specialists). The following medicines may be included in treatment:  Opioids given in place of the abused opioid. They turn on opioid receptors in the brain and lessen or prevent withdrawal symptoms. They are gradually decreased (opioid substitution and taper).  Non-opioids that can lessen certain opioid withdrawal symptoms. They may be used alone or with opioid substitution and taper. Successful long-term recovery usually requires medicine, counseling, and group support. HOME CARE INSTRUCTIONS   Take medicines only as directed by your health care provider.  Check with your health care provider before starting new medicines.  Keep all follow-up visits as directed by your health care provider. SEEK MEDICAL CARE IF:  You are not able to take your medicines as directed.  Your symptoms get worse.  You relapse. SEEK IMMEDIATE MEDICAL CARE IF:  You have serious thoughts about hurting yourself or others.  You have a seizure.  You lose consciousness. Document Released: 08/17/2003 Document Revised: 12/29/2013 Document Reviewed: 08/27/2013 [facility] Patient Information 2015 [place], [place]. This information is not intended to replace advice given to you by your health care provider. Make sure you discuss any questions you have with your health care provider.  Opioid Use Disorder Opioid use disorder is a mental disorder. It is the continued nonmedical use of opioids in spite of risks to health and well-being. Misused opioids include the street drug heroin. They also include pain medicines such as morphine, hydrocodone, oxycodone, and fentanyl. Opioids are very addictive. People who misuse opioids get an exaggerated feeling of well-being. Opioid use disorder often disrupts activities at home, work, or school. It may cause mental or physical problems.  A family history of  opioid use disorder puts you at higher risk of it. People with opioid use disorder often misuse other drugs or have mental  illness such as depression, posttraumatic stress disorder, or antisocial personality disorder. They also are at risk of suicide and death from overdose. SIGNS AND SYMPTOMS  Signs and symptoms of opioid use disorder include:  Use of opioids in larger amounts or over a longer period than intended.  Unsuccessful attempts to cut down or control opioid use.  A lot of time spent obtaining, using, or recovering from the effects of opioids.  A strong desire or urge to use opioids (craving).  Continued use of opioids in spite of major problems at work, school, or home because of use.  Continued use of opioids in spite of relationship problems because of use.  Giving up or cutting down on important life activities because of opioid use.  Use of opioids over and over in situations when it is physically hazardous, such as driving a car.  Continued use of opioids in spite of a physical problem that is likely related to use. Physical problems can include:  Severe constipation.  Poor nutrition.  Infertility.  Tuberculosis.  Aspiration pneumonia.  Infections such as human immunodeficiency virus (HIV) and hepatitis (from injecting opioids).  Continued use of opioids in spite of a mental problem that is likely related to use. Mental problems can include:  Depression.  Anxiety.  Hallucinations.  Sleep problems.  Loss of sexual function.  Need to use more and more opioids to get the same effect, or lessened effect over time with use of the same amount (tolerance).  Having withdrawal symptoms when opioid use is stopped, or using opioids to reduce or avoid withdrawal symptoms. Withdrawal symptoms include:  Depressed, anxious, or irritable mood.  Nausea, vomiting, diarrhea, or intestinal cramping.  Muscle aches or spasms.  Excessive tearing or runny nose.  Dilated pupils, sweating, or hairs standing on end.  Yawning.  Fever, raised blood pressure, or fast pulse.  Restlessness  or trouble sleeping. This does not apply to people taking opioids for medical reasons only. DIAGNOSIS Opioid use disorder is diagnosed by your health care provider. You may be asked questions about your opioid use and and how it affects your life. A physical exam may be done. A drug screen may be ordered. You may be referred to a mental health professional. The diagnosis of opioid use disorder requires at least two symptoms within 12 months. The type of opioid use disorder you have depends on the number of signs and symptoms you have. The type may be:  Mild. Two or three signs and symptoms.   Moderate. Four or five signs and symptoms.   Severe. Six or more signs and symptoms. TREATMENT  Treatment is usually provided by mental health professionals with training in substance use disorders.The following options are available:  Detoxification.This is the first step in treatment for withdrawal. It is medically supervised withdrawal with the use of medicines. These medicines lessen withdrawal symptoms. They also raise the chance of becoming opioid free.  Counseling, also known as talk therapy. Talk therapy addresses the reasons you use opioids. It also addresses ways to keep you from using again (relapse). The goals of talk therapy are to avoid relapse by:  Identifying and avoiding triggers for use.  Finding healthy ways to cope with stress.  Learning how to handle cravings.  Support groups. Support groups provide emotional support, advice, and guidance.  A medicine that blocks opioid receptors in your  brain. This medicine can reduce opioid cravings that lead to relapse. This medicine also blocks the desired opioid effect when relapse occurs.  Opioids that are taken by mouth in place of the misused opioid (opioid maintenance treatment). These medicines satisfy cravings but are safer than commonly misused opioids. This often is the best option for people who continue to relapse with other  treatments. HOME CARE INSTRUCTIONS   Take medicines only as directed by your health care provider.  Check with your health care provider before starting new medicines.  Keep all follow-up visits as directed by your health care provider. SEEK MEDICAL CARE IF:  You are not able to take your medicines as directed.  Your symptoms get worse. SEEK IMMEDIATE MEDICAL CARE IF:  You have serious thoughts about hurting yourself or others.  You may have taken an overdose of opioids. FOR MORE INFORMATION  National Institute on Drug Abuse: http://www.price-smith.com/  Substance Abuse and Mental Health Services Administration: SkateOasis.com.pt Document Released: 06/11/2007 Document Revised: 12/29/2013 Document Reviewed: 08/27/2013 [facility] Patient Information 2015 [place], [place]. This information is not intended to replace advice given to you by your health care provider. Make sure you discuss any questions you have with your health care provider.    Emergency Department Resource Guide 1) Find a Doctor and Pay Out of Pocket Although you won't have to find out who is covered by your insurance plan, it is a good idea to ask around and get recommendations. You will then need to call the office and see if the doctor you have chosen will accept you as a new patient and what types of options they offer for patients who are self-pay. Some doctors offer discounts or will set up payment plans for their patients who do not have insurance, but you will need to ask so you aren't surprised when you get to your appointment.  2) Contact Your Local Health Department Not all health departments have doctors that can see patients for sick visits, but many do, so it is worth a call to see if yours does. If you don't know where your local health department is, you can check in your phone book. The CDC also has a tool to help you locate your state's health department, and many state websites also have listings of all of their  local health departments.  3) Find a Walk-in Clinic If your illness is not likely to be very severe or complicated, you may want to try a walk in clinic. These are popping up all over the country in pharmacies, drugstores, and shopping centers. They're usually staffed by nurse practitioners or physician assistants that have been trained to treat common illnesses and complaints. They're usually fairly quick and inexpensive. However, if you have serious medical issues or chronic medical problems, these are probably not your best option.  No Primary Care Doctor: - Call Health Connect at  [phone] - they can help you locate a primary care doctor that  accepts your insurance, provides certain services, etc. - Physician Referral Service- [phone]  Chronic Pain Problems: Organization         Address  Phone   Notes  [facility]  [phone] Patients need to be referred by their primary care doctor.   Medication Assistance: Organization         Address  Phone   Notes  [facility] [address] [place], [place] [address] [phone] --Must be a resident of [address]  [place] -- Must have NO insurance coverage whatsoever (no Medicaid/ Medicare, etc.) -- The pt. MUST have a primary care doctor that directs their care regularly and follows them in the community   [facility]ist  [phone]   [employer]  [phone]    Agencies that provide inexpensive medical care: Organization         Address  Phone   Notes  [facility]  [phone]   [facility]    [phone]   [facility] [address] [place], [place] [address] [phone]   [facility] [address], [place] [phone]   Planned Parenthood    [phone]   [facility]    [phone]   [facility]  [address], [place]  Phone:  [phone], Fax:  [phone] Hours of Operation:  9 am - 6 pm, M-F.  Also accepts Medicaid/Medicare and self-pay.  [facility]  [address], [place] Phone: [phone], Fax: [phone]. Hours of Operation:  8:30 am - 5:30 pm, M-F.  Also accepts Medicaid and self-pay.  [facility] [place] [address], [place] Phone: [phone]   [facility] [address] Natasha Bence [place], [place] [phone], Ext. [phone] Mondays & Thursdays: 7-9 AM.  First 15 patients are seen on a first come, first serve basis.    Medicaid-accepting [facility] Providers:  Organization         Address  Phone   Notes  [facility] [address], [place] [phone] Also accepts self-pay patients.  [facility] [address] Laurell Josephs [place], [place]  [phone]   [facility] [address], [place] [phone]   [facility] [address], [place] [phone]   Renaye Rakers [address], [place]   [phone] Only accepts [place] Access [place] patients after they have their name applied to their card.   Self-Pay (no insurance) in [facility]:  Organization         Address  Phone   Notes  Sickle Cell Patients, [facility] [address] [place], [place] [phone]   [facility] [address] [place], [place] [phone]   [facility] [place]  [address], [place] [phone]   [facility]/Dr. Osei-Bonsu  [address], [place] or [address], [place] [phone] Phone number for both [place] and [place] locations is the same.  [facility] [address], [place] [phone]   [facility] [address], [place] or [address]  Dr [phone]   [facility] [address] [place] [phone], phone; [phone], fax Sees patients 1st and 3rd Saturday of every month.  Must not qualify for public or private insurance (i.e. Medicaid, Medicare, [place] Health Choice, Veterans' Benefits)  Household income should be no more than 200% of the poverty level The clinic cannot treat you if you are pregnant or think you are pregnant  Sexually transmitted diseases are not treated at the clinic.    Dental Care: Organization         Address  Phone  Notes  [facility] [address], [place] [phone] Accepts children up to age 31 who are enrolled in [place] or [place] Health Choice; pregnant women with a Medicaid card; and children who have applied for Medicaid or [place] Health Choice, but were declined, whose parents can pay a reduced fee at time of service.  [facility] [place]  [address], [place] [phone] Accepts children up to age 31 who are enrolled in [place] or [place] Health Choice; pregnant women with a Medicaid card; and children who have applied for Medicaid or [place] Health Choice, but were declined, whose parents can pay a reduced fee at time of service.  [facility]  [address], [place] [phone] Patients are seen by appointment only. Walk-ins are not accepted. [facility] will see patients 10718 years of age and older. Monday - Tuesday (8am-5pm) Most Wednesdays (8:30-5pm) $30 per visit, cash only  [facility]  [address], [facility] [place] [phone] Patients are seen by appointment only. Walk-ins are not accepted. [facility] will see patients 31 years of age and older. One Wednesday Evening (Monthly: Volunteer Based).  $30 per visit, cash only  [employer] [facility]  [phone]) [phone] for  adults; Children under age 664, call Graduate Pediatric Dentistry at [phone]. Children aged 214-14, please call [phone]) [phone] to request a pediatric application.  Dental services are provided in all areas of dental care including fillings, crowns and bridges, complete and partial dentures, implants, gum treatment, root canals, and extractions. Preventive care is also provided. Treatment is provided to both adults and children. Patients are selected via a lottery and there is often a waiting list.   [facility] [address], [place]  [phone] [email or web address]   [facility] [address], [place], [place] [phone], Ext. [phone] Second and Fourth Thursday of each month, opens at 6:30 AM; Clinic ends at 9 AM.  Patients are seen on a first-come first-served basis, and a limited number are seen during each clinic.   [facility]  [address], [place], [place] [phone]   Eligibility Requirements You must have lived in [place], [place], or [place] counties for at least the last three months.   You cannot be eligible for state or federal sponsored National Cityhealthcare insurance, including CIGNAVeterans Administration, [place]icaid, or [employer] EntertainmentMedicare.   You generally cannot be eligible for healthcare insurance through your employer.    How to apply: Eligibility screenings are held every Tuesday and Wednesday afternoon from 1:00 pm until 4:00 pm. You do not need an appointment for the interview!  [facility] [address], [place], [place] [phone]   [facility]  [phone]   [facility]  [phone]   [facility]  [phone]    Behavioral Health Resources in the Community: Intensive Outpatient Programs Organization         Address  Phone  Notes  [facility] [address], [place], [place] [phone]   [facility]  [address], [place], [place] [phone]   [facility] [address], [place], [place]  [phone]   [facility] [address],  [place], [place] [phone] or [phone]   Substance Abuse Resources Organization  Address  Phone  Notes  Alcohol and Drug Services  [phone]   [facility]  [phone]   [facility]  [phone]   Floydene Flock  [phone]   Residential & Outpatient Substance Abuse Program  [phone]   Psychological Services Organization         Address  Phone  Notes  [facility] Behavior[facility]  [phone]   [facility] Services  [phone]   [facility] [address] [place] [phone] or [phone]    Mobile Crisis Teams Organization         Address  Phone  Notes  [facility], Mobile Crisis Care Unit  [phone]   [facility]  [address] [place], [place] [phone]   Doristine Locks [address] [place] [phone]    Self-Help/Support Groups Organization         Address  Phone             Notes  [facility] [place] - variety of support groups  [phone]- [phone] Call for more information  Narcotics Anonymous (NA), [facility] [address], [employer] [place]  2 meetings at this location   Statistician         Address  Phone  Notes  [facility] [address],    [place]  [phone]   [facility]  [address], [place] [address], [place], [place] [phone]   [facility] [address] [place], [place] [phone] Admissions: 8am-3pm M-F  [facility] [address],    [place], [place] [phone]   [facility] [address] [place], [place], [place] [phone]   [facility] [address]  [place], [place] [phone]   [facility]  - Intensive Outpatient [address]urell [address], [place], [place] [phone]   [facility] ([facility] [address]  [place], [place] [phone] or [phone]   [facility] ([facility]) [address], [place], [place] [phone] Accepts Medicaid  [facility] [address]  [place] [phone] Substance Abuse/Addiction Treatment   [facility] Organization         Address  Phone  Notes  [facility]  [phone]   Angie Fava, PhD [address] Ervin Knack [place], [place]   [phone] or [phone]   [facility] Behavioral   [address] [place], [place] [phone]   [facility] [address], [place], [place] [phone] Insurance/Medicaid/sponsorship through [facility] and Families [address]                                    [place], [place] [phone] Therapy/tele-psych/case  [facility] [address], [place] [phone]    Dr. Lolly Mustache  [phone]   [facility]  [facility] Dept. 1) [address] [place] 2) [address], [place] 3)  [address], [place] [phone]  [phone]   [facility] Child Abuse Hotline 775-803-8553 or 5106478474 (After Hours)

## 2014-06-12 NOTE — ED Notes (Signed)
Pt in restroom to obtain urine specimen.

## 2014-06-12 NOTE — ED Notes (Addendum)
Pt here for opiate detox. Receives 60 percocet tablets every 10 days for back pain. Also uses heroin, last dose several days ago. Uses xanax prn, last dose a few days ago. Reports chills, nausea and diarrhea. No hallucination. Uses etoh every so often. Pt reports suicidal thoughts due to symptoms, but states she dose not want to kill herself. No past history of suicide attempts.

## 2014-06-12 NOTE — ED Notes (Signed)
Pt escorted to discharge window. Verbalized understanding discharge instructions. In no acute distress.  Pt provided w/ a copy of lab work.

## 2014-06-12 NOTE — BH Assessment (Signed)
Patient requesting inpatient detox at [facility]. Writer confirmed bed availability at [facility]. Completed and faxed patients labs, clinicals, etc to [facility] for review.

## 2014-06-12 NOTE — ED Notes (Signed)
This RN spoke w/ [facility].  [facility] is filling out and faxing paperwork for [facility].    Pt has been advised that she can not leave or she is not longer cleared.

## 2014-06-29 ENCOUNTER — Encounter (HOSPITAL_COMMUNITY): Payer: Self-pay | Admitting: Emergency Medicine

## 2016-02-07 ENCOUNTER — Emergency Department
Admission: EM | Admit: 2016-02-07 | Discharge: 2016-02-07 | Disposition: A | Discharge status: Home / Self Care | Payer: Medicaid Other | Source: Home / Self Care | Attending: Family Medicine | Admitting: Family Medicine

## 2016-02-07 ENCOUNTER — Encounter: Payer: Self-pay | Admitting: *Deleted

## 2016-02-07 DIAGNOSIS — R110 Nausea: Secondary | ICD-10-CM | POA: Diagnosis not present

## 2016-02-07 DIAGNOSIS — R300 Dysuria: Secondary | ICD-10-CM | POA: Diagnosis not present

## 2016-02-07 DIAGNOSIS — N390 Urinary tract infection, site not specified: Secondary | ICD-10-CM | POA: Diagnosis not present

## 2016-02-07 HISTORY — DX: Depression, unspecified: F32.A

## 2016-02-07 HISTORY — DX: Major depressive disorder, single episode, unspecified: F32.9

## 2016-02-07 LAB — POCT URINALYSIS DIP (MANUAL ENTRY)
Bilirubin, UA: NEGATIVE
Glucose, UA: 100 — AB
Ketones, POC UA: NEGATIVE
Nitrite, UA: NEGATIVE
Protein Ur, POC: 30 — AB
Spec Grav, UA: 1.02 (ref 1.005–1.03)
Urobilinogen, UA: 0.2 (ref 0–1)
pH, UA: 6 (ref 5–8)

## 2016-02-07 MED ORDER — ONDANSETRON 4 MG PO TBDP
4.0000 mg | ORAL_TABLET | Freq: Once | ORAL | Status: AC
  Administered 2016-02-07: 4 mg via ORAL

## 2016-02-07 MED ORDER — ONDANSETRON HCL 4 MG PO TABS: 4.0000 mg | ORAL_TABLET | Freq: Four times a day (QID) | ORAL | Status: DC

## 2016-02-07 MED ORDER — PHENAZOPYRIDINE HCL 200 MG PO TABS: 200.0000 mg | ORAL_TABLET | Freq: Three times a day (TID) | ORAL | Status: DC

## 2016-02-07 MED ORDER — CEPHALEXIN 500 MG PO CAPS: 500.0000 mg | ORAL_CAPSULE | Freq: Two times a day (BID) | ORAL | Status: DC

## 2016-02-07 MED ORDER — FLUCONAZOLE 150 MG PO TABS: 150.0000 mg | ORAL_TABLET | Freq: Once | ORAL | Status: DC

## 2016-02-07 NOTE — Discharge Instructions (Signed)
Pyridium has been prescribed to help with urinary discomfort and bladder spasms.  This medication can cause your urine to be orange, this is normal. If Pyridium is not covered by your insurance, you may try over the counter medication called Azo to help with bladder spasms.  This medication can make your urine orange, which is normal.

## 2016-02-07 NOTE — ED Notes (Signed)
Pt c/o dysuria x 1 1/2 weeks. Later developed flank pain, pelvic pain, nausea and some dizziness. Low grade fever for the past 2 days. Taken IBF and uricalm otc.

## 2016-02-07 NOTE — ED Provider Notes (Signed)
CSN: [id number]     Arrival date & time 02/07/16  1850 History   First MD Initiated Contact with Patient 02/07/16 1908     Chief Complaint  Patient presents with  . Dysuria   (Consider location/radiation/quality/duration/timing/severity/associated sxs/prior Treatment) HPI  Audrey Edwards is a 35 y.o. female presenting to UC with c/o gradually worsening dysuria with associated lower back pain, pelvic pain, nausea, and mild dizziness.  Symptoms started about 1.5 weeks ago, a few days after having intercourse with a new partner. Denies having UTIs in the past but does report not urinating after having intercourse.  Pt also reports hx of recurrent yeast infections as she is diabetic.  Low grade fever for last 2 days. She has tried ibuprofen and uricalm OTC w/o relief.  Denies vaginal discharge. LMP: 02/06/16   Past Medical History  Diagnosis Date  . Diabetes mellitus without complication (HCC)   . Gestational diabetes   . Anxiety   . Depression    Past Surgical History  Procedure Laterality Date  . Cesarean section    . Rotator cuff repair  2012  . Bunionectomy  2013  . Eye surgery    . Cesarean section N/A 02/26/2013    Procedure: CESAREAN SECTION;  Surgeon: Philip Aspen, DO;  Location: [facility];  Service: Obstetrics;  Laterality: N/A;   Family History  Problem Relation Age of Onset  . Obesity Other   . Stroke Other    Social History  Substance Use Topics  . Smoking status: Current Some Day Smoker -- 0.30 packs/day    Types: Cigarettes  . Smokeless tobacco: Never Used  . Alcohol Use: No   OB History    Gravida Para Term Preterm AB TAB SAB Ectopic Multiple Living   Review of Systems  Constitutional: Negative for fever and chills.  Gastrointestinal: Positive for nausea and abdominal pain ( lower). Negative for vomiting and diarrhea.  Genitourinary: Positive for dysuria, urgency, frequency, flank pain and pelvic pain. Negative for hematuria and decreased  urine volume.  Musculoskeletal: Positive for back pain ( lower). Negative for myalgias and arthralgias.  Skin: Negative for rash.    Allergies  Tramadol  Home Medications   Prior to Admission medications   Medication Sig Start Date End Date Taking? Authorizing Provider  DULoxetine (CYMBALTA) 20 MG capsule Take 20 mg by mouth daily.   Yes Historical Provider, MD  cephALEXin (KEFLEX) 500 MG capsule Take 1 capsule (500 mg total) by mouth 2 (two) times daily. For 7 days 02/07/16   Junius Finner, PA-C  fluconazole (DIFLUCAN) 150 MG tablet Take 1 tablet (150 mg total) by mouth once. Take one if signs of yeast infection, may repeat in 3 days if still symptomatic 02/07/16   Junius Finner, PA-C  ondansetron (ZOFRAN) 4 MG tablet Take 1 tablet (4 mg total) by mouth every 6 (six) hours. 02/07/16   Junius Finner, PA-C  phenazopyridine (PYRIDIUM) 200 MG tablet Take 1 tablet (200 mg total) by mouth 3 (three) times daily. 02/07/16   Junius Finner, PA-C   Meds Ordered and Administered this Visit   Medications  ondansetron (ZOFRAN-ODT) disintegrating tablet 4 mg (4 mg Oral Given 02/07/16 1931)    BP 133/86 mmHg  Pulse 75  Temp(Src) 98.6 F (37 C) (Oral)  Wt 128 lb (58.06 kg)  SpO2 100%  LMP 02/06/2016 No data found.   Physical Exam  Constitutional: She appears well-developed and well-nourished.  No distress.  HENT:  Head: Normocephalic and atraumatic.  Mouth/Throat: Oropharynx is clear and moist.  Eyes: Conjunctivae are normal. No scleral icterus.  Neck: Normal range of motion. Neck supple.  Cardiovascular: Normal rate, regular rhythm and normal heart sounds.   Pulmonary/Chest: Effort normal and breath sounds normal. No respiratory distress. She has no wheezes. She has no rales.  Abdominal: Soft. She exhibits no distension and no mass. There is no tenderness. There is no rebound and no guarding.  Soft, non-distended. Tenderness to suprapubic region w/o rebound or guarding. No CVAT   Musculoskeletal: Normal range of motion.  Neurological: She is alert.  Skin: Skin is warm and dry. She is not diaphoretic.  Nursing note and vitals reviewed.   ED Course  Procedures (including critical care time)  Labs Review Labs Reviewed  POCT URINALYSIS DIP (MANUAL ENTRY) - Abnormal; Notable for the following:    Clarity, UA cloudy (*)    Glucose, UA =100 (*)    Blood, UA small (*)    Protein Ur, POC =30 (*)    Leukocytes, UA moderate (2+) (*)    All other components within normal limits  URINE CULTURE    Imaging Review No results found.   MDM   1. Dysuria   2. UTI (lower urinary tract infection)   3. Nausea without vomiting    Hx and exam c/w UTI Urine culture sent.  Rx: Keflex, zofran, pyridium, and diflucan (reports hx of yeast infections after taking antibiotics)   F/u with PCP in 3-4 days if not improving, sooner if worsening. Encouraged to stay well hydrated. Patient verbalized understanding and agreement with treatment plan.     Junius Finnerrin O'Malley, PA-C 02/07/16 1946

## 2016-02-11 LAB — URINE CULTURE: Colony Count: >=100000

## 2016-02-23 ENCOUNTER — Emergency Department
Admission: EM | Admit: 2016-02-23 | Discharge: 2016-02-23 | Disposition: A | Discharge status: Home / Self Care | Payer: Medicaid Other | Source: Home / Self Care

## 2016-02-23 ENCOUNTER — Encounter: Payer: Self-pay | Admitting: *Deleted

## 2016-02-23 DIAGNOSIS — R208 Other disturbances of skin sensation: Secondary | ICD-10-CM | POA: Diagnosis not present

## 2016-02-23 MED ORDER — TRIAMCINOLONE ACETONIDE 40 MG/ML IJ SUSP
40.0000 mg | Freq: Once | INTRAMUSCULAR | Status: AC
  Administered 2016-02-23: 40 mg via INTRAMUSCULAR

## 2016-02-23 NOTE — ED Notes (Signed)
Pt c/o her face burning and itching x 1200 today. She took Benadryl 50 mg at 1530 today and applied hydrocortisone cream.

## 2016-02-23 NOTE — Discharge Instructions (Signed)
Apply cool compress every 1 to 2 hours until improved.  May continue Benadryl 25mg  every 6 hours as needed. If symptoms become significantly worse during the night or over the weekend, proceed to the local emergency room.    Paresthesia Paresthesia is an abnormal burning or prickling sensation. This sensation is generally felt in the hands, arms, legs, or feet. However, it may occur in any part of the body. Usually, it is not painful. The feeling may be described as:  Tingling or numbness.  Pins and needles.  Skin crawling.  Buzzing.  Limbs falling asleep.  Itching. Most people experience temporary (transient) paresthesia at some time in their lives. Paresthesia may occur when you breathe too quickly (hyperventilation). It can also occur without any apparent cause. Commonly, paresthesia occurs when pressure is placed on a nerve. The sensation quickly goes away after the pressure is removed. For some people, however, paresthesia is a long-lasting (chronic) condition that is caused by an underlying disorder. If you continue to have paresthesia, you may need further medical evaluation. HOME CARE INSTRUCTIONS Watch your condition for any changes. Taking the following actions may help to lessen any discomfort that you are feeling:  Avoid drinking alcohol.  Try acupuncture or massage to help relieve your symptoms.  Keep all follow-up visits as directed by your health care provider. This is important. SEEK MEDICAL CARE IF:  You continue to have episodes of paresthesia.  Your burning or prickling feeling gets worse when you walk.  You have pain, cramps, or dizziness.  You develop a rash. SEEK IMMEDIATE MEDICAL CARE IF:  You feel weak.  You have trouble walking or moving.  You have problems with speech, understanding, or vision.  You feel confused.  You cannot control your bladder or bowel movements.  You have numbness after an injury.  You faint.   This information is not  intended to replace advice given to you by your health care provider. Make sure you discuss any questions you have with your health care provider.   Document Released: 08/04/2002 Document Revised: 12/29/2014 Document Reviewed: 08/10/2014 Elsevier Interactive Patient Education [employer]2016 [employer] Inc.

## 2016-02-23 NOTE — ED Provider Notes (Signed)
CSN: [id number]     Arrival date & time 02/23/16  1715 History   None    Chief Complaint  Patient presents with  . Rash      HPI Comments: Patient complains of onset of burning sensation in face and scalp approximately 3 hours ago without swelling or rash.  She denies contact with known allergen, although she was around a cat earlier today.  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  No new foods or medications.  No shortness of breath, wheezing, or difficulty swallowing.  She feels well otherwise.  She has taken Benadryl and applied 1% HC cream with slight improvement.  Review of [place] Controlled Substance Reporting System reveals that patient takes Suboxone regularly  The history is provided by the patient.    Past Medical History  Diagnosis Date  . Diabetes mellitus without complication (HCC)   . Gestational diabetes   . Anxiety   . Depression    Past Surgical History  Procedure Laterality Date  . Cesarean section    . Rotator cuff repair  2012  . Bunionectomy  2013  . Eye surgery    . Cesarean section N/A 02/26/2013    Procedure: CESAREAN SECTION;  Surgeon: Philip AspenSidney Callahan, DO;  Location: [facility];  Service: Obstetrics;  Laterality: N/A;   Family History  Problem Relation Age of Onset  . Adopted: Yes  . Obesity Other   . Stroke Other    Social History  Substance Use Topics  . Smoking status: Current Every Day Smoker -- 1.00 packs/day    Types: Cigarettes  . Smokeless tobacco: Never Used  . Alcohol Use: Yes   OB History    Gravida Para Term Preterm AB TAB SAB Ectopic Multiple Living   2 2 2       2      Review of Systems  Constitutional: Negative for fever, chills, diaphoresis, activity change and fatigue.  HENT: Negative.   Eyes: Negative.   Respiratory: Negative.   Cardiovascular: Negative.   Gastrointestinal: Negative.   Genitourinary: Negative.   Musculoskeletal: Negative.   Skin: Negative for color change and rash.   Neurological: Negative for speech difficulty, light-headedness, numbness and headaches.       Burning sensation face and scalp    Allergies  Tramadol  Home Medications   Prior to Admission medications   Medication Sig Start Date End Date Taking? Authorizing Provider  metFORMIN (GLUCOPHAGE) 500 MG tablet Take 500 mg by mouth 2 (two) times daily with a meal.   Yes Historical Provider, MD  DULoxetine (CYMBALTA) 20 MG capsule Take 20 mg by mouth daily.    Historical Provider, MD   Meds Ordered and Administered this Visit   Medications  triamcinolone acetonide (KENALOG-40) injection 40 mg (not administered)    BP 162/92 mmHg  Pulse 123  Temp(Src) 98.4 F (36.9 C) (Oral)  Resp 16  Ht 5\' 1"  (1.549 m)  Wt 132 lb (59.875 kg)  BMI 24.95 kg/m2  SpO2 99%  LMP 02/06/2016 No data found.   Physical Exam  Constitutional: She is oriented to person, place, and time. She appears well-developed and well-nourished. No distress.  HENT:  Head: Normocephalic.  Right Ear: Tympanic membrane, external ear and ear canal normal.  Left Ear: Tympanic membrane, external ear and ear canal normal.  Eyes: Conjunctivae and EOM are normal. Pupils are equal, round, and reactive to light. Right eye exhibits no discharge. Left eye exhibits no discharge.  Neck: Neck supple.  Cardiovascular: Normal heart sounds.   Heart rate 100  Pulmonary/Chest: Breath sounds normal.  Abdominal: There is no tenderness.  Musculoskeletal: She exhibits no edema.  Lymphadenopathy:    She has no cervical adenopathy.  Neurological: She is alert and oriented to person, place, and time.  Skin: Skin is warm and dry. No rash noted. She is not diaphoretic. No erythema.  Nursing note and vitals reviewed.   ED Course  Procedures none    MDM   1. Facial burning; paresthesia ?etiology   Normal exam reassuring Administered Kenalog 40mg  IM Apply cool compress every 1 to 2 hours until improved.  May continue Benadryl 25mg   every 6 hours as needed. If symptoms become significantly worse during the night or over the weekend, proceed to the local emergency room.     Lattie HawStephen A Jahne Krukowski, MD 02/23/16 [phone]

## 2019-12-07 ENCOUNTER — Other Ambulatory Visit: Payer: Self-pay

## 2019-12-07 ENCOUNTER — Encounter (INDEPENDENT_AMBULATORY_CARE_PROVIDER_SITE_OTHER): Payer: Self-pay

## 2019-12-07 ENCOUNTER — Emergency Department (HOSPITAL_COMMUNITY): Payer: Medicaid Other

## 2019-12-07 ENCOUNTER — Emergency Department (HOSPITAL_COMMUNITY)
Admission: EM | Admit: 2019-12-07 | Discharge: 2019-12-07 | Disposition: A | Discharge status: Home / Self Care | Payer: Medicaid Other | Attending: Emergency Medicine | Admitting: Emergency Medicine

## 2019-12-07 ENCOUNTER — Encounter (HOSPITAL_COMMUNITY): Payer: Self-pay | Admitting: Emergency Medicine

## 2019-12-07 DIAGNOSIS — F17210 Nicotine dependence, cigarettes, uncomplicated: Secondary | ICD-10-CM | POA: Diagnosis not present

## 2019-12-07 DIAGNOSIS — E1165 Type 2 diabetes mellitus with hyperglycemia: Secondary | ICD-10-CM | POA: Insufficient documentation

## 2019-12-07 DIAGNOSIS — Z9114 Patient's other noncompliance with medication regimen: Secondary | ICD-10-CM | POA: Diagnosis not present

## 2019-12-07 DIAGNOSIS — R002 Palpitations: Secondary | ICD-10-CM | POA: Diagnosis not present

## 2019-12-07 DIAGNOSIS — R739 Hyperglycemia, unspecified: Secondary | ICD-10-CM

## 2019-12-07 DIAGNOSIS — R0602 Shortness of breath: Secondary | ICD-10-CM | POA: Diagnosis present

## 2019-12-07 HISTORY — DX: Opioid abuse, uncomplicated: F11.10

## 2019-12-07 LAB — URINALYSIS, ROUTINE W REFLEX MICROSCOPIC
Bacteria, UA: NONE SEEN
Bilirubin Urine: NEGATIVE
Glucose, UA: >=500 mg/dL — AB
Hgb urine dipstick: NEGATIVE
Ketones, ur: NEGATIVE mg/dL
Leukocytes,Ua: NEGATIVE
Nitrite: NEGATIVE
Protein, ur: NEGATIVE mg/dL
Specific Gravity, Urine: 1.028 (ref 1.005–1.030)
pH: 8 (ref 5.0–8.0)

## 2019-12-07 LAB — CBC
HCT: 41.4 % (ref 36.0–46.0)
Hemoglobin: 12.4 g/dL (ref 12.0–15.0)
MCH: 20.1 pg — ABNORMAL LOW (ref 26.0–34.0)
MCHC: 30 g/dL (ref 30.0–36.0)
MCV: 67.2 fL — ABNORMAL LOW (ref 80.0–100.0)
Platelets: 292 10*3/uL (ref 150–400)
RBC: 6.16 MIL/uL — ABNORMAL HIGH (ref 3.87–5.11)
RDW: 15.1 % (ref 11.5–15.5)
WBC: 7.5 10*3/uL (ref 4.0–10.5)
nRBC: 0 % (ref 0.0–0.2)

## 2019-12-07 LAB — BASIC METABOLIC PANEL
Anion gap: 13 (ref 5–15)
BUN: 10 mg/dL (ref 6–20)
CO2: 23 mmol/L (ref 22–32)
Calcium: 9.9 mg/dL (ref 8.9–10.3)
Chloride: 98 mmol/L (ref 98–111)
Creatinine, Ser: 0.64 mg/dL (ref 0.44–1.00)
GFR calc Af Amer: >60 mL/min (ref >60)
GFR calc non Af Amer: >60 mL/min (ref >60)
Glucose, Bld: 370 mg/dL — ABNORMAL HIGH (ref 70–99)
Potassium: 4.1 mmol/L (ref 3.5–5.1)
Sodium: 134 mmol/L — ABNORMAL LOW (ref 135–145)

## 2019-12-07 LAB — CBG MONITORING, ED: Glucose-Capillary: 355 mg/dL — ABNORMAL HIGH (ref 70–99)

## 2019-12-07 LAB — I-STAT BETA HCG BLOOD, ED (MC, WL, AP ONLY): I-stat hCG, quantitative: <5 m[IU]/mL (ref <5)

## 2019-12-07 LAB — TROPONIN I (HIGH SENSITIVITY): Troponin I (High Sensitivity): 3 ng/L (ref <18)

## 2019-12-07 LAB — D-DIMER, QUANTITATIVE: D-Dimer, Quant: 0.42 ug/mL-FEU (ref 0.00–0.50)

## 2019-12-07 MED ORDER — SODIUM CHLORIDE 0.9% FLUSH: 3.0000 mL | Freq: Once | INTRAVENOUS | Status: DC

## 2019-12-07 MED ORDER — METFORMIN HCL 500 MG PO TABS
500.0000 mg | ORAL_TABLET | Freq: Once | ORAL | Status: AC
  Administered 2019-12-07: 05:00:00 500 mg via ORAL
  Filled 2019-12-07: qty 1

## 2019-12-07 MED ORDER — METFORMIN HCL 500 MG PO TABS: 500.0000 mg | ORAL_TABLET | Freq: Two times a day (BID) | ORAL | 0 refills | Status: AC

## 2019-12-07 MED ORDER — INSULIN ASPART 100 UNIT/ML ~~LOC~~ SOLN
2.0000 [IU] | Freq: Once | SUBCUTANEOUS | Status: AC
  Administered 2019-12-07: 2 [IU] via SUBCUTANEOUS

## 2019-12-07 NOTE — ED Provider Notes (Addendum)
[facility] EMERGENCY DEPARTMENT Provider Note   CSN: [id number] Arrival date & time: 12/07/19  7062     History Chief Complaint  Patient presents with  . Shortness of Breath    Audrey Edwards is a 39 y.o. female.  The history is provided by the patient.  Palpitations Palpitations quality:  Regular Onset quality:  Gradual Timing:  Constant Progression:  Waxing and waning Chronicity:  New Context: not appetite suppressants, not blood loss and not bronchodilators   Relieved by:  Nothing Worsened by:  Nothing Ineffective treatments:  None tried Associated symptoms: no back pain, no chest pain, no chest pressure, no cough, no diaphoresis, no dizziness, no hemoptysis, no lower extremity edema, no malaise/fatigue, no nausea, no near-syncope, no numbness, no orthopnea, no PND, no syncope, no vomiting and no weakness   Risk factors: no hx of PE and no hyperthyroidism   Patient with DM off her meds for a month presents with palpitations and SOB ongoing.  No cough.  No exertional symptoms.  No f/c/r.  No n/v/d.       Past Medical History:  Diagnosis Date  . Anxiety   . Depression   . Diabetes mellitus without complication ([place])   . Gestational diabetes   . Heroin abuse ([place])     There are no problems to display for this patient.   Past Surgical History:  Procedure Laterality Date  . BUNIONECTOMY  2013  . CESAREAN SECTION    . CESAREAN SECTION N/A 02/26/2013   Procedure: CESAREAN SECTION;  Surgeon: Audrey Edwards;  Location: [facility];  Service: Obstetrics;  Laterality: N/A;  . EYE SURGERY    . ROTATOR CUFF REPAIR  2012     OB History    Gravida  2   Para  2   Term  2   Preterm      AB      Living  2     SAB      TAB      Ectopic      Multiple      Live Births  2           Family History  Adopted: Yes  Problem Relation Age of Onset  . Obesity Other   . Stroke Other     Social History   Tobacco Use  . Smoking status:  Current Every Day Smoker    Packs/day: 1.00    Types: Cigarettes  . Smokeless tobacco: Never Used  Substance Use Topics  . Alcohol use: Yes  . Drug use: Yes    Types: Oxycodone    Comment: heroin    Home Medications Prior to Admission medications   Medication Sig Start Date End Date Taking? Authorizing Provider  DULoxetine (CYMBALTA) 20 MG capsule Take 20 mg by mouth daily.    [provider]  metFORMIN (GLUCOPHAGE) 500 MG tablet Take 500 mg by mouth 2 (two) times daily with a meal.    [provider]    Allergies    Tramadol  Review of Systems   Review of Systems  Constitutional: Negative for diaphoresis, fever and malaise/fatigue.  HENT: Negative for congestion.   Eyes: Negative for visual disturbance.  Respiratory: Negative for cough and hemoptysis.   Cardiovascular: Positive for palpitations. Negative for chest pain, orthopnea, leg swelling, syncope, PND and near-syncope.  Gastrointestinal: Negative for nausea and vomiting.  Genitourinary: Negative for difficulty urinating.  Musculoskeletal: Negative for arthralgias and back pain.  Skin: Negative  for rash.  Neurological: Negative for dizziness, weakness and numbness.  Psychiatric/Behavioral: Negative for agitation.  All other systems reviewed and are negative.   Physical Exam Updated Vital Signs BP (!) 150/97 (BP Location: Right Arm)   Pulse 80   Temp 98.5 F (36.9 C) (Oral)   Resp 18   SpO2 99%   Physical Exam Vitals and nursing note reviewed.  Constitutional:      General: She is not in acute distress.    Appearance: She is normal weight.  HENT:     Head: Normocephalic and atraumatic.     Nose: Nose normal.  Eyes:     Conjunctiva/sclera: Conjunctivae normal.     Pupils: Pupils are equal, round, and reactive to light.  Cardiovascular:     Rate and Rhythm: Normal rate and regular rhythm.     Pulses: Normal pulses.     Heart sounds: Normal heart sounds.  Pulmonary:     Effort:  Pulmonary effort is normal.     Breath sounds: Normal breath sounds.  Abdominal:     General: Abdomen is flat. Bowel sounds are normal.     Tenderness: There is no abdominal tenderness. There is no guarding or rebound.  Musculoskeletal:        General: No tenderness. Normal range of motion.     Cervical back: Normal range of motion and neck supple.     Right lower leg: No edema.     Left lower leg: No edema.  Skin:    General: Skin is warm and dry.     Capillary Refill: Capillary refill takes less than 2 seconds.  Neurological:     General: No focal deficit present.     Mental Status: She is alert and oriented to person, place, and time.     Deep Tendon Reflexes: Reflexes normal.  Psychiatric:     Comments: anxious     ED Results / Procedures / Treatments   Labs (all labs ordered are listed, but only abnormal results are displayed) Labs Reviewed  CBG MONITORING, ED - Abnormal; Notable for the following components:      Result Value   Glucose-Capillary 355 (*)    All other components within normal limits  BASIC METABOLIC PANEL  CBC  URINALYSIS, ROUTINE W REFLEX MICROSCOPIC  I-STAT BETA HCG BLOOD, ED ([facility], [facility], [facility] ONLY)  TROPONIN I (HIGH SENSITIVITY)    EKG EKG Interpretation  Date/Time:  Sunday Audrey Edwards 11 2021 02:39:13 EDT Ventricular Rate:  69 PR Interval:  144 QRS Duration: 80 QT Interval:  392 QTC Calculation: 420 R Axis:   48 Text Interpretation: Normal sinus rhythm Confirmed by Audrey Edwards ([id number]) on 12/07/2019 3:09:24 AM   Radiology No results found.  Procedures Procedures (including critical care time)  Medications Ordered in ED Medications  sodium chloride flush (NS) 0.9 % injection 3 mL (has no administration in time range)    ED Course  I have reviewed the triage vital signs and the nursing notes.  Pertinent labs & imaging results that were available during my care of the patient were reviewed by me and considered in my medical decision making  (see chart for details).    Given the time course one troponin with a negative ekg is sufficient to rule out ACS.  Very low risk for PE with negative ddimer excludes PE.  I am not sure of the cause of the palpitations but we have not seen anything on the monitor.    Audrey Edwards was  evaluated in Emergency Department on 12/07/2019 for the symptoms described in the history of present illness. She was evaluated in the context of the global COVID-19 pandemic, which necessitated consideration that the patient might be at risk for infection with the SARS-CoV-2 virus that causes COVID-19. Institutional protocols and algorithms that pertain to the evaluation of patients at risk for COVID-19 are in a state of rapid change based on information released by regulatory bodies including the CDC and federal and state organizations. These policies and algorithms were followed during the patient's care in the ED.  Final Clinical Impression(s) / ED Diagnoses  Return for weakness, numbness, changes in vision or speech, fevers >100.4 unrelieved by medication, shortness of breath, intractable vomiting, or diarrhea, abdominal pain, Inability to tolerate liquids or food, cough, altered mental status or any concerns. No signs of systemic illness or infection. The patient is nontoxic-appearing on exam and vital signs are within normal limits.   I have reviewed the triage vital signs and the nursing notes. Pertinent labs &imaging results that were available during my care of the patient were reviewed by me and considered in my medical decision making (see chart for details).  After history, exam, and medical workup I feel the patient has been appropriately medically screened and is safe for discharge home. Pertinent diagnoses were discussed with the patient. Patient was givenstrictreturn precautions.    Gage Treiber, MD 12/07/19 [id number]    Cy Blamer, MD 12/07/19 2774

## 2019-12-07 NOTE — ED Triage Notes (Signed)
Pt c/o shob, palpitations dizziness, intermittent x 1 month mostly after using vape. Pt reports not taking DM meds d/t not liking the way it makes her feel. Pt reports she has had an issue with heroin, suboxone and pain pills in the past, last snorted heroin 2 days ago.

## 2019-12-15 ENCOUNTER — Emergency Department (HOSPITAL_COMMUNITY): Payer: Medicaid Other

## 2019-12-15 ENCOUNTER — Encounter (HOSPITAL_COMMUNITY): Payer: Self-pay | Admitting: Emergency Medicine

## 2019-12-15 ENCOUNTER — Other Ambulatory Visit: Payer: Self-pay

## 2019-12-15 ENCOUNTER — Emergency Department (HOSPITAL_COMMUNITY)
Admission: EM | Admit: 2019-12-15 | Discharge: 2019-12-15 | Disposition: A | Discharge status: Home / Self Care | Payer: Medicaid Other | Attending: Emergency Medicine | Admitting: Emergency Medicine

## 2019-12-15 DIAGNOSIS — Z7984 Long term (current) use of oral hypoglycemic drugs: Secondary | ICD-10-CM | POA: Diagnosis not present

## 2019-12-15 DIAGNOSIS — R0602 Shortness of breath: Secondary | ICD-10-CM | POA: Diagnosis present

## 2019-12-15 DIAGNOSIS — Z20822 Contact with and (suspected) exposure to covid-19: Secondary | ICD-10-CM | POA: Diagnosis not present

## 2019-12-15 DIAGNOSIS — F17210 Nicotine dependence, cigarettes, uncomplicated: Secondary | ICD-10-CM | POA: Diagnosis not present

## 2019-12-15 DIAGNOSIS — R42 Dizziness and giddiness: Secondary | ICD-10-CM | POA: Diagnosis not present

## 2019-12-15 DIAGNOSIS — E119 Type 2 diabetes mellitus without complications: Secondary | ICD-10-CM | POA: Insufficient documentation

## 2019-12-15 LAB — URINALYSIS, ROUTINE W REFLEX MICROSCOPIC
Bacteria, UA: NONE SEEN
Bilirubin Urine: NEGATIVE
Glucose, UA: >=500 mg/dL — AB
Ketones, ur: NEGATIVE mg/dL
Leukocytes,Ua: NEGATIVE
Nitrite: NEGATIVE
Protein, ur: NEGATIVE mg/dL
Specific Gravity, Urine: 1.016 (ref 1.005–1.030)
pH: 8 (ref 5.0–8.0)

## 2019-12-15 LAB — BASIC METABOLIC PANEL
Anion gap: 18 — ABNORMAL HIGH (ref 5–15)
BUN: 9 mg/dL (ref 6–20)
CO2: 21 mmol/L — ABNORMAL LOW (ref 22–32)
Calcium: 10 mg/dL (ref 8.9–10.3)
Chloride: 98 mmol/L (ref 98–111)
Creatinine, Ser: 0.72 mg/dL (ref 0.44–1.00)
GFR calc Af Amer: >60 mL/min (ref >60)
GFR calc non Af Amer: >60 mL/min (ref >60)
Glucose, Bld: 289 mg/dL — ABNORMAL HIGH (ref 70–99)
Potassium: 4 mmol/L (ref 3.5–5.1)
Sodium: 137 mmol/L (ref 135–145)

## 2019-12-15 LAB — CBC
HCT: 45.3 % (ref 36.0–46.0)
Hemoglobin: 13.2 g/dL (ref 12.0–15.0)
MCH: 20.2 pg — ABNORMAL LOW (ref 26.0–34.0)
MCHC: 29.1 g/dL — ABNORMAL LOW (ref 30.0–36.0)
MCV: 69.4 fL — ABNORMAL LOW (ref 80.0–100.0)
Platelets: 280 10*3/uL (ref 150–400)
RBC: 6.53 MIL/uL — ABNORMAL HIGH (ref 3.87–5.11)
RDW: 16.3 % — ABNORMAL HIGH (ref 11.5–15.5)
WBC: 7.5 10*3/uL (ref 4.0–10.5)
nRBC: 0 % (ref 0.0–0.2)

## 2019-12-15 LAB — I-STAT BETA HCG BLOOD, ED (MC, WL, AP ONLY): I-stat hCG, quantitative: <5 m[IU]/mL (ref <5)

## 2019-12-15 LAB — CBG MONITORING, ED: Glucose-Capillary: 310 mg/dL — ABNORMAL HIGH (ref 70–99)

## 2019-12-15 LAB — TSH: TSH: 1.676 u[IU]/mL (ref 0.350–4.500)

## 2019-12-15 LAB — T4, FREE: Free T4: 0.8 ng/dL (ref 0.61–1.12)

## 2019-12-15 LAB — POC SARS CORONAVIRUS 2 AG -  ED: SARS Coronavirus 2 Ag: NEGATIVE

## 2019-12-15 MED ORDER — PREDNISONE 20 MG PO TABS: 40.0000 mg | ORAL_TABLET | Freq: Every day | ORAL | 0 refills | Status: AC

## 2019-12-15 MED ORDER — SODIUM CHLORIDE 0.9% FLUSH: 3.0000 mL | Freq: Once | INTRAVENOUS | Status: DC

## 2019-12-15 MED ORDER — ALBUTEROL SULFATE HFA 108 (90 BASE) MCG/ACT IN AERS
2.0000 | INHALATION_SPRAY | Freq: Four times a day (QID) | RESPIRATORY_TRACT | Status: DC
  Administered 2019-12-15: 11:00:00 2 via RESPIRATORY_TRACT
  Filled 2019-12-15: qty 6.7

## 2019-12-15 MED ORDER — PREDNISONE 20 MG PO TABS
60.0000 mg | ORAL_TABLET | ORAL | Status: AC
  Administered 2019-12-15: 11:00:00 60 mg via ORAL
  Filled 2019-12-15: qty 3

## 2019-12-15 NOTE — ED Triage Notes (Addendum)
Pt in w/sob, palpitations and dizziness. States worsening x 1 wk. Endorses some sort of either Heroine or fentanyl snorting 4hrs PTA. Feels she is going to pass out. C/o cp, anxious in triage. States her blood sugar is high, despite medication use at home (has DM), CBG 310 in triage

## 2019-12-15 NOTE — ED Provider Notes (Signed)
[facility] EMERGENCY DEPARTMENT Provider Note   CSN: [id number] Arrival date & time: 12/15/19  0407     History Chief Complaint  Patient presents with  . Dizziness  . Hyperglycemia  . Palpitations    Audrey Edwards is a 39 y.o. female.  HPI   Patient presents with concern of shortness of breath, lightheadedness, fullness sensation in her throat and upper chest. Onset was maybe 2 weeks ago, and symptoms have been progressing since that time.  She notes that she was seen here once about 1 week ago, and after having evaluation, treatment of hypoglycemia, she was discharged with outpatient follow-up scheduled for today.  She notes that overnight she felt progressively more short of breath, more unsettled about her difficulty with breathing and now presents for evaluation. She states that she has a history of gestational diabetes, and polysubstance abuse.  She states that she has never injected any illicit substances, but snorts cocaine, heroin, or other drugs. She is a former smoker, currently using vaping products for substitute. No fever, no confusion, no other chest pain.   Past Medical History:  Diagnosis Date  . Anxiety   . Depression   . Diabetes mellitus without complication ([place])   . Gestational diabetes   . Heroin abuse ([place])     There are no problems to display for this patient.   Past Surgical History:  Procedure Laterality Date  . BUNIONECTOMY  2013  . CESAREAN SECTION    . CESAREAN SECTION N/A 02/26/2013   Procedure: CESAREAN SECTION;  Surgeon: Allyn Kenner, DO;  Location: [facility];  Service: Obstetrics;  Laterality: N/A;  . EYE SURGERY    . ROTATOR CUFF REPAIR  2012     OB History    Gravida  2   Para  2   Term  2   Preterm      AB      Living  2     SAB      TAB      Ectopic      Multiple      Live Births  2           Family History  Adopted: Yes  Problem Relation Age of Onset  . Obesity Other   . Stroke  Other     Social History   Tobacco Use  . Smoking status: Current Every Day Smoker    Packs/day: 1.00    Types: Cigarettes  . Smokeless tobacco: Never Used  Substance Use Topics  . Alcohol use: Yes  . Drug use: Yes    Types: Oxycodone    Comment: heroin    Home Medications Prior to Admission medications   Medication Sig Start Date End Date Taking? Authorizing Provider  clindamycin (CLEOCIN T) 1 % external solution Apply 1 application topically in the morning and at bedtime. On face lesions 11/27/19  Yes [provider]  etonogestrel-ethinyl estradiol (NUVARING) 0.12-0.015 MG/24HR vaginal ring Place 1 each vaginally See admin instructions. For 3 consecutive weeks, then remove for 1 week. 12/10/19  Yes [provider]  metFORMIN (GLUCOPHAGE) 500 MG tablet Take 1 tablet (500 mg total) by mouth 2 (two) times daily with a meal. 12/07/19  Yes Palumbo, April, MD  pentoxifylline (TRENTAL) 400 MG CR tablet Take 400 mg by mouth in the morning and at bedtime. 10/29/19   [provider]    Allergies    Onion  Review of Systems   Review of Systems  Constitutional:       Per HPI, otherwise negative  HENT:       Per HPI, otherwise negative  Respiratory:       Per HPI, otherwise negative  Cardiovascular:       Per HPI, otherwise negative  Gastrointestinal: Negative for vomiting.  Endocrine:       Negative aside from HPI  Genitourinary:       Neg aside from HPI   Musculoskeletal:       Per HPI, otherwise negative  Skin: Negative.   Neurological: Negative for syncope.    Physical Exam Updated Vital Signs BP (!) 132/91   Pulse 74   Temp 98.3 F (36.8 C) (Oral)   Resp (!) 9   Ht 5\' 1"  (1.549 m)   Wt 61 kg   LMP 12/13/2019   SpO2 99% Comment: Room air  BMI 25.41 kg/m   Physical Exam Vitals and nursing note reviewed.  Constitutional:      General: She is not in acute distress.    Appearance: She is well-developed.  HENT:     Head: Normocephalic and  atraumatic.  Eyes:     Conjunctiva/sclera: Conjunctivae normal.  Cardiovascular:     Rate and Rhythm: Normal rate and regular rhythm.  Pulmonary:     Effort: Pulmonary effort is normal. No respiratory distress.     Breath sounds: Normal breath sounds. No stridor.  Abdominal:     General: There is no distension.  Skin:    General: Skin is warm and dry.  Neurological:     Mental Status: She is alert and oriented to person, place, and time.     Cranial Nerves: No cranial nerve deficit.     ED Results / Procedures / Treatments   Labs (all labs ordered are listed, but only abnormal results are displayed) Labs Reviewed  BASIC METABOLIC PANEL - Abnormal; Notable for the following components:      Result Value   CO2 21 (*)    Glucose, Bld 289 (*)    Anion gap 18 (*)    All other components within normal limits  CBC - Abnormal; Notable for the following components:   RBC 6.53 (*)    MCV 69.4 (*)    MCH 20.2 (*)    MCHC 29.1 (*)    RDW 16.3 (*)    All other components within normal limits  URINALYSIS, ROUTINE W REFLEX MICROSCOPIC - Abnormal; Notable for the following components:   Color, Urine STRAW (*)    Glucose, UA >=500 (*)    Hgb urine dipstick SMALL (*)    All other components within normal limits  CBG MONITORING, ED - Abnormal; Notable for the following components:   Glucose-Capillary 310 (*)    All other components within normal limits  TSH  T4, FREE  I-STAT BETA HCG BLOOD, ED ([facility], [facility], [facility] ONLY)  POC SARS CORONAVIRUS 2 AG -  ED    EKG EKG Interpretation  Date/Time:  Monday December 15 2019 04:17:04 EDT Ventricular Rate:  85 PR Interval:  142 QRS Duration: 82 QT Interval:  368 QTC Calculation: 437 R Axis:   31 Text Interpretation: Normal sinus rhythm Nonspecific ST abnormality Abnormal ECG When compared with ECG of 12/07/2019, Nonspecific ST abnormality is now present Confirmed by 02/06/2020 (Dione Booze) on 12/15/2019 6:20:18 AM   Radiology DG Neck Soft  Tissue  Result Date: 12/15/2019 CLINICAL DATA:  Shortness of breath, palpitations and dizziness. Hyperglycemia. EXAM: NECK SOFT TISSUES - 1+  VIEW COMPARISON:  Cervical spine dated 08/01/2011 FINDINGS: Again demonstrated is reversal of the normal cervical lordosis. Multiple missing teeth. Otherwise, normal appearing bones and soft tissues. Normal appearing epiglottis. No subglottic airway narrowing. IMPRESSION: No acute abnormality. Electronically Signed   By: Beckie Salts M.D.   On: 12/15/2019 09:35   DG Chest 2 View  Result Date: 12/15/2019 CLINICAL DATA:  Shortness of breath, palpitations and dizziness. Hypertension. Smoker. EXAM: CHEST - 2 VIEW COMPARISON:  12/07/2019 FINDINGS: Normal sized heart. Clear lungs with normal vascularity. Minimal peribronchial thickening with minimal progression. Unremarkable bones. IMPRESSION: Minimal bronchitic changes with minimal progression. Electronically Signed   By: Beckie Salts M.D.   On: 12/15/2019 09:43    Procedures Procedures (including critical care time)  Medications Ordered in ED Medications  sodium chloride flush (NS) 0.9 % injection 3 mL (has no administration in time range)  albuterol (VENTOLIN HFA) 108 (90 Base) MCG/ACT inhaler 2 puff (has no administration in time range)  predniSONE (DELTASONE) tablet 60 mg (has no administration in time range)    ED Course  I have reviewed the triage vital signs and the nursing notes.  Pertinent labs & imaging results that were available during my care of the patient were reviewed by me and considered in my medical decision making (see chart for details).    MDM Rules/Calculators/A&P                      10:52 AM On repeat exam patient is awake, alert, in no distress, with no supplemental oxygen, no increased work of breathing. We discussed x-rays, labs which I have reviewed.  Findings reassuring, no evidence for pneumonia, no soft tissue abnormalities on her neck x-ray. Patient does have some  evidence for bronchitis, this fits the patient's history of chronic cigarette use. Absent oxygen requirement, patient is appropriate for initiation of steroids, bronchodilators, and has follow-up scheduled later this afternoon.  In addition, given smoking history, bronchitis, she will follow-up with pulmonology. On reviewing the patient's chart is clear she had a D-dimer performed last week, and absent new oxygen requirement, without negative result, additional imaging not currently indicated. Patient's thyroid tests were reassuring. Final Clinical Impression(s) / ED Diagnoses Final diagnoses:  SOB (shortness of breath)    Rx / DC Orders ED Discharge Orders         Ordered    predniSONE (DELTASONE) 20 MG tablet  Daily with breakfast     12/15/19 1100           Gerhard Munch, MD 12/15/19 1100

## 2019-12-15 NOTE — Discharge Instructions (Signed)
As discussed, today's evaluation has been generally reassuring, and there is some suspicion for bronchitis contributing to your difficulty breathing.  Please take your prescribed medication as directed for the next 4 days, and use your albuterol inhaler for additional relief. It is portly keep your follow-up appointment today, and schedule I for our pulmonology colleagues as well.  Your thyroid test results will be available shortly, please discuss these with your physician.  Return here for concerning changes in your condition.

## 2019-12-17 ENCOUNTER — Encounter: Payer: Self-pay | Admitting: Pulmonary Disease

## 2019-12-17 ENCOUNTER — Ambulatory Visit: Payer: Medicaid Other | Admitting: Pulmonary Disease

## 2019-12-17 ENCOUNTER — Other Ambulatory Visit: Payer: Self-pay

## 2019-12-17 VITALS — BP 102/68 | HR 80 | Temp 98.2°F | Ht 61.0 in | Wt 133.2 lb

## 2019-12-17 DIAGNOSIS — R0602 Shortness of breath: Secondary | ICD-10-CM | POA: Diagnosis not present

## 2019-12-17 MED ORDER — FLOVENT HFA 110 MCG/ACT IN AERO: 2.0000 | INHALATION_SPRAY | Freq: Two times a day (BID) | RESPIRATORY_TRACT | 6 refills | Status: AC

## 2019-12-17 MED ORDER — CLARITHROMYCIN 500 MG PO TABS: 500.0000 mg | ORAL_TABLET | Freq: Two times a day (BID) | ORAL | 0 refills | Status: AC

## 2019-12-17 NOTE — Patient Instructions (Signed)
Shortness of breath, chest tightness  Continue prednisone, complete course Continue Mucinex  We will give you a course of antibiotics-Biaxin for 7 days Steroid inhaler, you may use this for about 3 to 4 weeks  Continue using albuterol as needed  Continue to work on quitting  I will see you in about 6 to 8 weeks Call with significant concerns

## 2019-12-17 NOTE — Progress Notes (Signed)
Audrey Edwards    [id number]    [birth year]  Primary Care Physician:Edwards, Audrey Coco  Referring Physician: Corine Shelter, PA-C [address] [place],  [name] [address]  Chief complaint:   Patient seen for shortness of breath, chest tightness  HPI:  Chest discomfort, chest tightness, throat tightness Active cigarette smoker Multisubstance abuse via inhalation  Recently was on the hospital-on steroids, Mucinex  History of diabetes-controlled  Still recently using illicit substances by inhalation -She is trying to quit -Trying to get some more help  Outpatient Encounter Medications as of 12/17/2019  Medication Sig  . clindamycin (CLEOCIN T) 1 % external solution Apply 1 application topically in the morning and at bedtime. On face lesions  . etonogestrel-ethinyl estradiol (NUVARING) 0.12-0.015 MG/24HR vaginal ring Place 1 each vaginally See admin instructions. For 3 consecutive weeks, then remove for 1 week.  . metFORMIN (GLUCOPHAGE) 500 MG tablet Take 1 tablet (500 mg total) by mouth 2 (two) times daily with a meal.  . pentoxifylline (TRENTAL) 400 MG CR tablet Take 400 mg by mouth in the morning and at bedtime.  . predniSONE (DELTASONE) 20 MG tablet Take 2 tablets (40 mg total) by mouth daily with breakfast. For the next four days   No facility-administered encounter medications on file as of 12/17/2019.    Allergies as of 12/17/2019 - Review Complete 12/15/2019  Allergen Reaction Noted  . Onion  12/15/2019    Past Medical History:  Diagnosis Date  . Anxiety   . Depression   . Diabetes mellitus without complication ([place])   . Gestational diabetes   . Heroin abuse [facility])     Past Surgical History:  Procedure Laterality Date  . BUNIONECTOMY  2013  . CESAREAN SECTION    . CESAREAN SECTION N/A 02/26/2013   Procedure: CESAREAN SECTION;  Surgeon: Allyn Kenner, DO;  Location: [facility];  Service: Obstetrics;  Laterality: N/A;  . EYE SURGERY    . ROTATOR  CUFF REPAIR  2012    Family History  Adopted: Yes  Problem Relation Age of Onset  . Obesity Other   . Stroke Other     Social History   Socioeconomic History  . Marital status: Divorced    Spouse name: Not on file  . Number of children: Not on file  . Years of education: Not on file  . Highest education level: Not on file  Occupational History  . Not on file  Tobacco Use  . Smoking status: Current Every Day Smoker    Packs/day: 1.00    Years: 10.00    Pack years: 10.00    Types: Cigarettes  . Smokeless tobacco: Never Used  Substance and Sexual Activity  . Alcohol use: Yes  . Drug use: Yes    Types: Oxycodone    Comment: heroin  . Sexual activity: Yes    Birth control/protection: None  Other Topics Concern  . Not on file  Social History Narrative  . Not on file   Social Determinants of Health   Financial Resource Strain:   . Difficulty of Paying Living Expenses:   Food Insecurity:   . Worried About Charity fundraiser in the Last Year:   . Arboriculturist in the Last Year:   Transportation Needs:   . Film/video editor (Medical):   Marland Kitchen Lack of Transportation (Non-Medical):   Physical Activity:   . Days of Exercise per Week:   . Minutes  of Exercise per Session:   Stress:   . Feeling of Stress :   Social Connections:   . Frequency of Communication with Friends and Family:   . Frequency of Social Gatherings with Friends and Family:   . Attends Religious Services:   . Active Member of Clubs or Organizations:   . Attends Banker Meetings:   Marland Kitchen Marital Status:   Intimate Partner Violence:   . Fear of Current or Ex-Partner:   . Emotionally Abused:   Marland Kitchen Physically Abused:   . Sexually Abused:     Review of Systems  Constitutional: Negative.   HENT: Negative.   Eyes: Negative.   Respiratory: Positive for shortness of breath.   Cardiovascular: Negative for chest pain.  Gastrointestinal: Negative.   Endocrine: Negative.   Genitourinary:  Negative.     Vitals:   12/17/19 0928  BP: 102/68  Pulse: 80  Temp: 98.2 F (36.8 C)  SpO2: 97%     Physical Exam  Constitutional: She appears well-developed and well-nourished.  HENT:  Head: Normocephalic and atraumatic.  Eyes: Conjunctivae are normal. Right eye exhibits no discharge. Left eye exhibits no discharge.  Neck: No tracheal deviation present. No thyromegaly present.  Cardiovascular: Normal rate and regular rhythm.  Pulmonary/Chest: Effort normal and breath sounds normal. No respiratory distress. She has no wheezes.  Abdominal: Bowel sounds are normal.  Musculoskeletal:     Cervical back: Normal range of motion and neck supple.  Skin: Skin is warm. Rash noted.   Data Reviewed: Recent chest x-ray reviewed by myself showing increased bronchovascular markings Recent blood work-normal CBC  Assessment:  Shortness of breath  Multisubstance abuse  Diabetes  Plan/Recommendations: Continue Mucinex  Complete course of prednisone  Add Biaxin 500 p.o. twice daily for 7 days  Flovent inhaler  Continue using albuterol as needed  Counseled about cessation of multiple substances  Smoking cessation  I will see her in about 6 to 8 weeks   Virl Diamond MD [facility] 12/17/2019, 9:57 AM  CC: Lovenia Kim, PA-C

## 2021-01-04 IMAGING — DX DG CHEST 2V
2 series · 2 of 2 positions shown · non-contrast
Comparison: 12/07/2019

CLINICAL DATA: Shortness of breath, palpitations and dizziness.
Hypertension. Smoker.

EXAM:
CHEST - 2 VIEW

[chest pa]
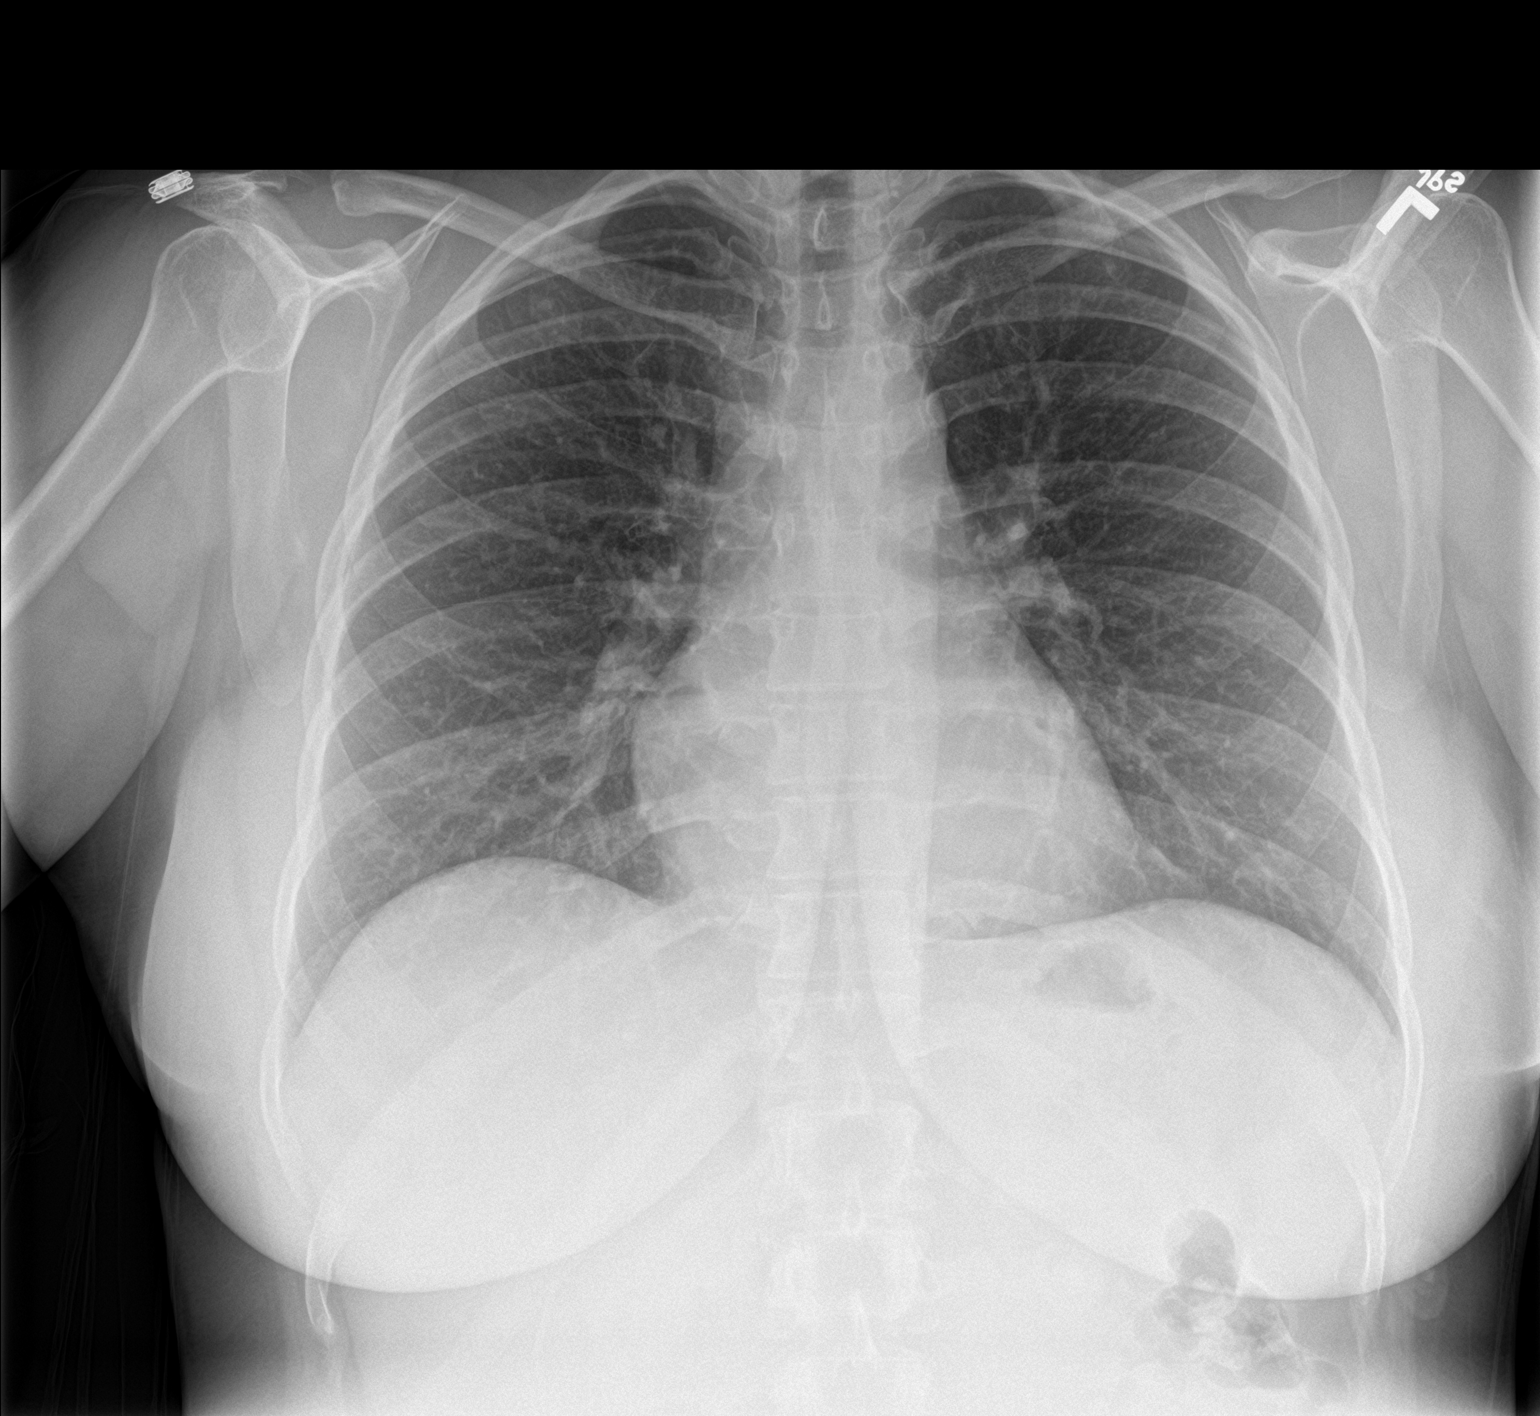

[chest lat]
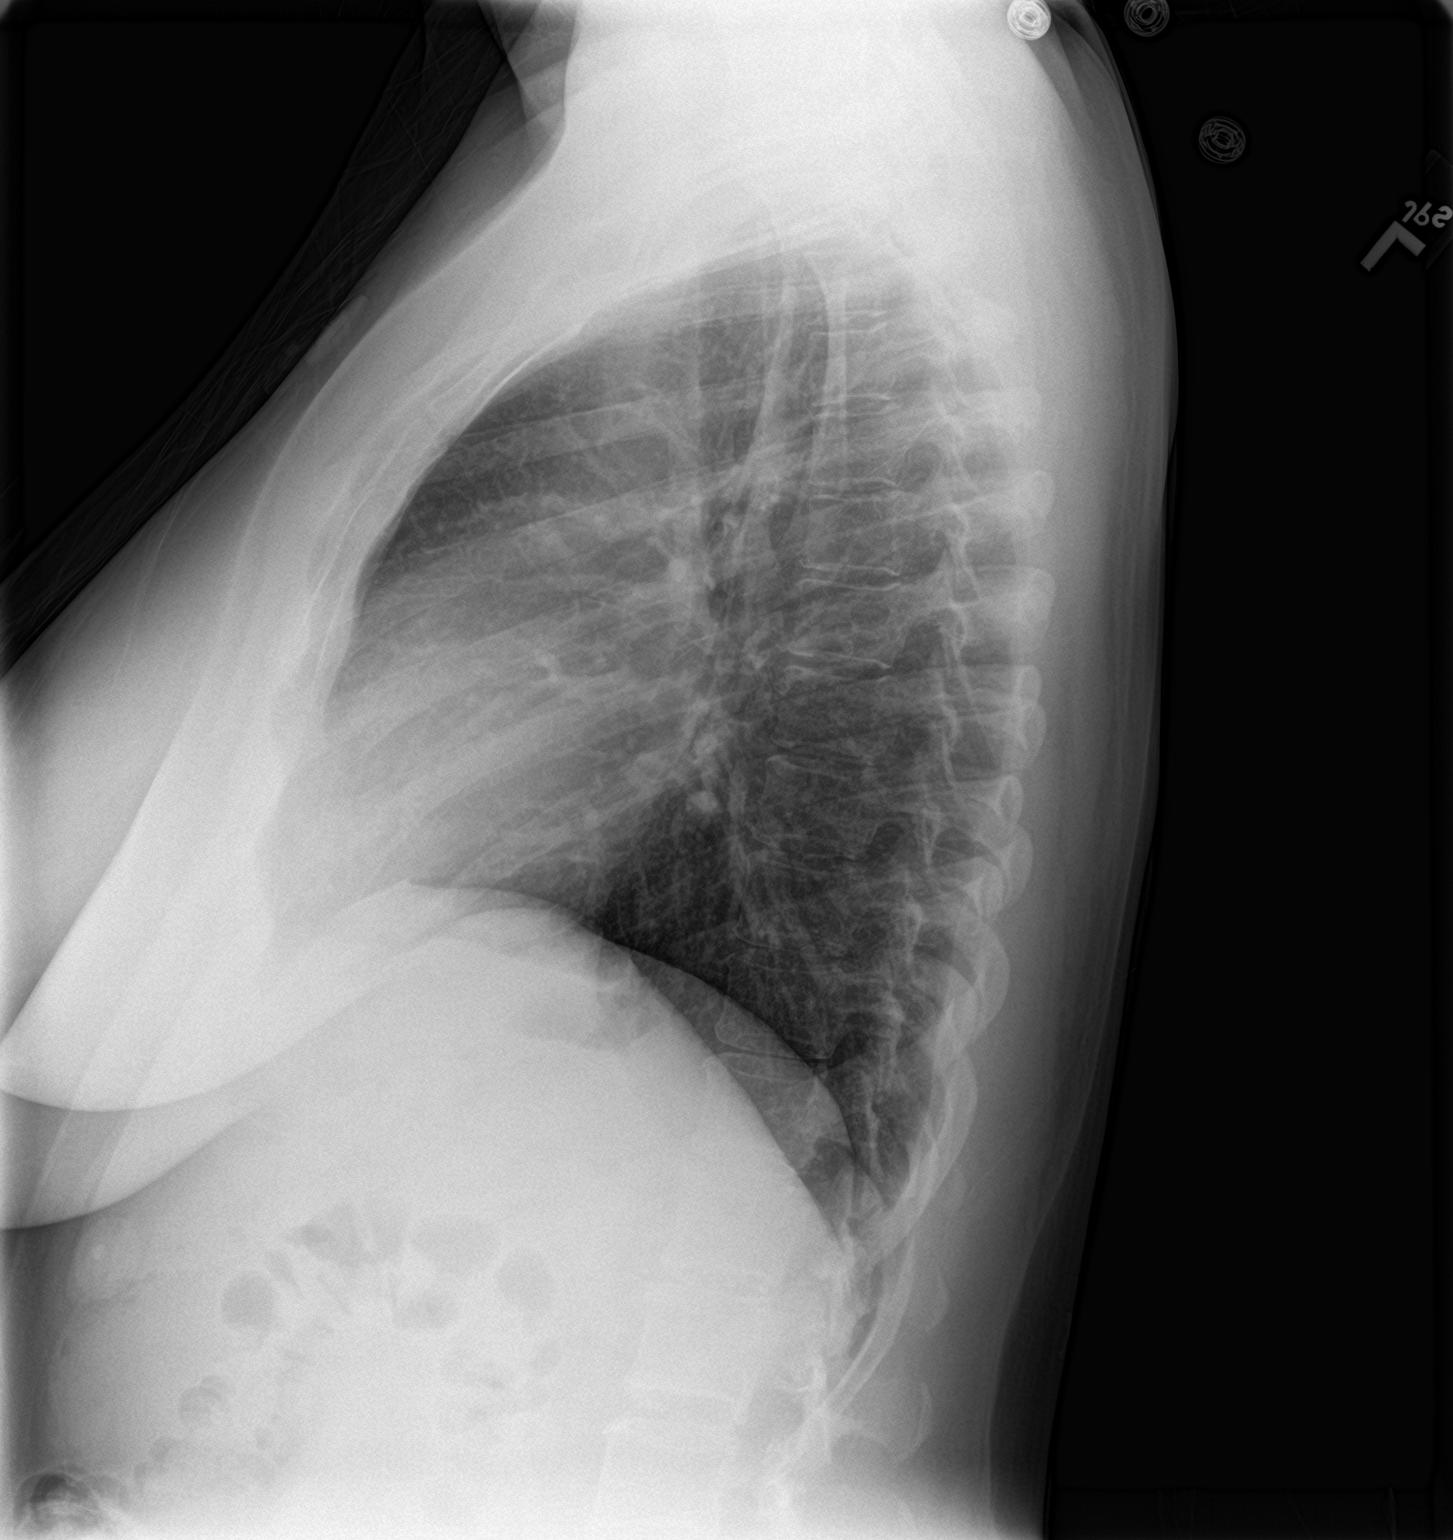

[2 of 2 positions shown; findings below may reference images not displayed]

FINDINGS: Normal sized heart. Clear lungs with normal vascularity. Minimal
peribronchial thickening with minimal progression. Unremarkable
bones.
IMPRESSION: Minimal bronchitic changes with minimal progression.

## 2021-06-29 ENCOUNTER — Emergency Department (HOSPITAL_BASED_OUTPATIENT_CLINIC_OR_DEPARTMENT_OTHER)
Admission: EM | Admit: 2021-06-29 | Discharge: 2021-06-30 | Disposition: A | Discharge status: Home / Self Care | Payer: Medicaid Other | Attending: Emergency Medicine | Admitting: Emergency Medicine

## 2021-06-29 ENCOUNTER — Other Ambulatory Visit: Payer: Self-pay

## 2021-06-29 ENCOUNTER — Emergency Department (HOSPITAL_BASED_OUTPATIENT_CLINIC_OR_DEPARTMENT_OTHER): Payer: Medicaid Other

## 2021-06-29 ENCOUNTER — Encounter (HOSPITAL_BASED_OUTPATIENT_CLINIC_OR_DEPARTMENT_OTHER): Payer: Self-pay | Admitting: Obstetrics and Gynecology

## 2021-06-29 DIAGNOSIS — R1013 Epigastric pain: Secondary | ICD-10-CM | POA: Insufficient documentation

## 2021-06-29 DIAGNOSIS — Z7984 Long term (current) use of oral hypoglycemic drugs: Secondary | ICD-10-CM | POA: Insufficient documentation

## 2021-06-29 DIAGNOSIS — F17210 Nicotine dependence, cigarettes, uncomplicated: Secondary | ICD-10-CM | POA: Diagnosis not present

## 2021-06-29 DIAGNOSIS — E119 Type 2 diabetes mellitus without complications: Secondary | ICD-10-CM | POA: Diagnosis not present

## 2021-06-29 DIAGNOSIS — K921 Melena: Secondary | ICD-10-CM | POA: Insufficient documentation

## 2021-06-29 LAB — I-STAT VENOUS BLOOD GAS, ED
Acid-base deficit: 1 mmol/L (ref 0.0–2.0)
Bicarbonate: 23.3 mmol/L (ref 20.0–28.0)
Calcium, Ion: 1.26 mmol/L (ref 1.15–1.40)
HCT: 44 % (ref 36.0–46.0)
Hemoglobin: 15 g/dL (ref 12.0–15.0)
O2 Saturation: 80 %
Potassium: 4 mmol/L (ref 3.5–5.1)
Sodium: 135 mmol/L (ref 135–145)
TCO2: 24 mmol/L (ref 22–32)
pCO2, Ven: 38.4 mmHg — ABNORMAL LOW (ref 44.0–60.0)
pH, Ven: 7.391 (ref 7.250–7.430)
pO2, Ven: 45 mmHg (ref 32.0–45.0)

## 2021-06-29 LAB — COMPREHENSIVE METABOLIC PANEL
ALT: 9 U/L (ref 0–44)
AST: 11 U/L — ABNORMAL LOW (ref 15–41)
Albumin: 4.7 g/dL (ref 3.5–5.0)
Alkaline Phosphatase: 64 U/L (ref 38–126)
Anion gap: 17 — ABNORMAL HIGH (ref 5–15)
BUN: 8 mg/dL (ref 6–20)
CO2: 19 mmol/L — ABNORMAL LOW (ref 22–32)
Calcium: 9.3 mg/dL (ref 8.9–10.3)
Chloride: 96 mmol/L — ABNORMAL LOW (ref 98–111)
Creatinine, Ser: 0.73 mg/dL (ref 0.44–1.00)
GFR, Estimated: >60 mL/min (ref >60)
Glucose, Bld: 390 mg/dL — ABNORMAL HIGH (ref 70–99)
Potassium: 3.7 mmol/L (ref 3.5–5.1)
Sodium: 132 mmol/L — ABNORMAL LOW (ref 135–145)
Total Bilirubin: 0.3 mg/dL (ref 0.3–1.2)
Total Protein: 8.1 g/dL (ref 6.5–8.1)

## 2021-06-29 LAB — URINALYSIS, ROUTINE W REFLEX MICROSCOPIC
Bilirubin Urine: NEGATIVE
Glucose, UA: >1000 mg/dL — AB
Hgb urine dipstick: NEGATIVE
Ketones, ur: 40 mg/dL — AB
Leukocytes,Ua: NEGATIVE
Nitrite: NEGATIVE
Protein, ur: NEGATIVE mg/dL
Specific Gravity, Urine: 1.039 — ABNORMAL HIGH (ref 1.005–1.030)
pH: 6 (ref 5.0–8.0)

## 2021-06-29 LAB — CBC
HCT: 41.6 % (ref 36.0–46.0)
Hemoglobin: 12.5 g/dL (ref 12.0–15.0)
MCH: 20.2 pg — ABNORMAL LOW (ref 26.0–34.0)
MCHC: 30 g/dL (ref 30.0–36.0)
MCV: 67.3 fL — ABNORMAL LOW (ref 80.0–100.0)
Platelets: 285 10*3/uL (ref 150–400)
RBC: 6.18 MIL/uL — ABNORMAL HIGH (ref 3.87–5.11)
RDW: 18 % — ABNORMAL HIGH (ref 11.5–15.5)
WBC: 5.3 10*3/uL (ref 4.0–10.5)
nRBC: 0 % (ref 0.0–0.2)

## 2021-06-29 LAB — PREGNANCY, URINE: Preg Test, Ur: NEGATIVE

## 2021-06-29 LAB — LIPASE, BLOOD: Lipase: 24 U/L (ref 11–51)

## 2021-06-29 LAB — CBG MONITORING, ED: Glucose-Capillary: 361 mg/dL — ABNORMAL HIGH (ref 70–99)

## 2021-06-29 MED ORDER — POLYETHYLENE GLYCOL 3350 17 G PO PACK: 17.0000 g | PACK | Freq: Every day | ORAL | 1 refills | Status: AC

## 2021-06-29 MED ORDER — FAMOTIDINE 20 MG PO TABS: 20.0000 mg | ORAL_TABLET | Freq: Two times a day (BID) | ORAL | 0 refills | Status: AC

## 2021-06-29 MED ORDER — POLYETHYLENE GLYCOL 3350 17 G PO PACK
17.0000 g | PACK | Freq: Once | ORAL | Status: AC
  Administered 2021-06-30: 17 g via ORAL
  Filled 2021-06-29: qty 1

## 2021-06-29 MED ORDER — SUCRALFATE 1 G PO TABS: 1.0000 g | ORAL_TABLET | Freq: Three times a day (TID) | ORAL | 1 refills | Status: AC

## 2021-06-29 MED ORDER — LACTATED RINGERS IV BOLUS
1000.0000 mL | Freq: Once | INTRAVENOUS | Status: AC
  Administered 2021-06-29: 1000 mL via INTRAVENOUS

## 2021-06-29 MED ORDER — SUCRALFATE 1 GM/10ML PO SUSP
1.0000 g | Freq: Once | ORAL | Status: AC
  Administered 2021-06-30: 1 g via ORAL
  Filled 2021-06-29: qty 10

## 2021-06-29 MED ORDER — FAMOTIDINE IN NACL 20-0.9 MG/50ML-% IV SOLN
20.0000 mg | Freq: Once | INTRAVENOUS | Status: AC
  Administered 2021-06-29: 20 mg via INTRAVENOUS
  Filled 2021-06-29: qty 50

## 2021-06-29 MED ORDER — IOHEXOL 300 MG/ML  SOLN
100.0000 mL | Freq: Once | INTRAMUSCULAR | Status: AC | PRN
  Administered 2021-06-29: 100 mL via INTRAVENOUS

## 2021-06-29 NOTE — ED Notes (Signed)
RT attempted PIV x2 w/success, first attempt PIV vessel blew, second attempt PIV secured, flushed, and saline locked w/out difficulty.

## 2021-06-29 NOTE — ED Triage Notes (Signed)
Patient reports to the ER for blood in stool. Patient reports copious amounts over the past week. Patient reports she is constipated and she is having joing pain in her legs and shoulders. Patient also reports she is a diabetic who is uncontrolled.

## 2021-06-29 NOTE — ED Provider Notes (Signed)
[facility] EMERGENCY DEPT Provider Note   CSN: [id number] Arrival date & time: 06/29/21  1639     History Chief Complaint  Patient presents with   Melena    Audrey Edwards is a 40 y.o. female.  HPI Patient reports history of abdominal pain for approximately 6 months.  She has been having epigastric pain is aching burning in quality.  She reports she is also been having bloody bowel movements.  She reports she is felt constipated and had to strain.  She said difficulty passing bowel movements.  Patient reports has had a lot of bright red blood.  No vomiting.  She reports she has had decreased appetite and feels really bloated and distended when she eats.  She denies the pain is significantly worse when she eats.  Patient reports she has an appointment with gastroenterology but it is not for another 2 months.  She reports she has been taking Protonix for 2 months without change in symptoms.  She does not drink alcohol except rarely on social occasions.  Patient is type I diabetic.  She reports that she forgot her insulin this morning and has missed a dose.    Past Medical History:  Diagnosis Date   Anxiety    Depression    Diabetes mellitus without complication (HCC)    Gestational diabetes    Heroin abuse (HCC)     There are no problems to display for this patient.   Past Surgical History:  Procedure Laterality Date   BUNIONECTOMY  2013   CESAREAN SECTION     CESAREAN SECTION N/A 02/26/2013   Procedure: CESAREAN SECTION;  Surgeon: Philip Aspen, DO;  Location: [facility];  Service: Obstetrics;  Laterality: N/A;   EYE SURGERY     ROTATOR CUFF REPAIR  2012     OB History     Gravida  2   Para  2   Term  2   Preterm      AB      Living  2      SAB      IAB      Ectopic      Multiple      Live Births  2           Family History  Adopted: Yes  Problem Relation Age of Onset   Obesity Other    Stroke Other     Social History    Tobacco Use   Smoking status: Every Day    Packs/day: 1.00    Years: 10.00    Pack years: 10.00    Types: Cigarettes   Smokeless tobacco: Never  Vaping Use   Vaping Use: Every day   Devices: flavored  Substance Use Topics   Alcohol use: Yes    Alcohol/week: 10.0 standard drinks    Types: 10 Standard drinks or equivalent per week   Drug use: Yes    Types: Oxycodone    Comment: heroin    Home Medications Prior to Admission medications   Medication Sig Start Date End Date Taking? Authorizing Provider  famotidine (PEPCID) 20 MG tablet Take 1 tablet (20 mg total) by mouth 2 (two) times daily. 06/29/21  Yes Lorayne Getchell, Lebron Conners, MD  polyethylene glycol (MIRALAX / GLYCOLAX) 17 g packet Take 17 g by mouth daily. 06/29/21  Yes Arby Barrette, MD  sucralfate (CARAFATE) 1 g tablet Take 1 tablet (1 g total) by mouth 4 (four) times daily -  with meals and at bedtime. 06/29/21  Yes Arby Barrette, MD  clarithromycin (BIAXIN) 500 MG tablet Take 1 tablet (500 mg total) by mouth 2 (two) times daily. 12/17/19   Olalere, Onnie Boer A, MD  clindamycin (CLEOCIN T) 1 % external solution Apply 1 application topically in the morning and at bedtime. On face lesions 11/27/19   [provider]  etonogestrel-ethinyl estradiol (NUVARING) 0.12-0.015 MG/24HR vaginal ring Place 1 each vaginally See admin instructions. For 3 consecutive weeks, then remove for 1 week. 12/10/19   [provider]  fluticasone (FLOVENT HFA) 110 MCG/ACT inhaler Inhale 2 puffs into the lungs in the morning and at bedtime. 12/17/19   Tomma Lightning, MD  metFORMIN (GLUCOPHAGE) 500 MG tablet Take 1 tablet (500 mg total) by mouth 2 (two) times daily with a meal. 12/07/19   Palumbo, April, MD  pentoxifylline (TRENTAL) 400 MG CR tablet Take 400 mg by mouth in the morning and at bedtime. 10/29/19   [provider]  predniSONE (DELTASONE) 20 MG tablet Take 2 tablets (40 mg total) by mouth daily with breakfast. For the next four  days 12/15/19   Gerhard Munch, MD    Allergies    Onion  Review of Systems   Review of Systems 10 systems reviewed and negative except as per HPI Physical Exam Updated Vital Signs BP 124/81   Pulse 79   Temp 98.4 F (36.9 C)   Resp 16   Ht 5\' 1"  (1.549 m)   Wt 60.4 kg   LMP 05/26/2021 (Approximate) Comment: negative pregnancy test  SpO2 99%   BMI 25.16 kg/m   Physical Exam Constitutional:      Comments: Alert nontoxic.  No respiratory distress.  Clear mental status.  HENT:     Head: Normocephalic and atraumatic.     Mouth/Throat:     Pharynx: Oropharynx is clear.  Eyes:     Extraocular Movements: Extraocular movements intact.  Cardiovascular:     Rate and Rhythm: Normal rate and regular rhythm.  Pulmonary:     Effort: Pulmonary effort is normal.     Breath sounds: Normal breath sounds.  Abdominal:     Comments: Abdomen soft.  Lower abdomen nontender.  Mild epigastric discomfort to palpation.  No guarding.  Musculoskeletal:        General: No swelling or tenderness. Normal range of motion.     Right lower leg: No edema.     Left lower leg: No edema.  Skin:    General: Skin is warm and dry.  Neurological:     General: No focal deficit present.     Mental Status: She is oriented to person, place, and time.     Motor: No weakness.     Coordination: Coordination normal.  Psychiatric:        Mood and Affect: Mood normal.    ED Results / Procedures / Treatments   Labs (all labs ordered are listed, but only abnormal results are displayed) Labs Reviewed  COMPREHENSIVE METABOLIC PANEL - Abnormal; Notable for the following components:      Result Value   Sodium 132 (*)    Chloride 96 (*)    CO2 19 (*)    Glucose, Bld 390 (*)    AST 11 (*)    Anion gap 17 (*)    All other components within normal limits  CBC - Abnormal; Notable for the following components:   RBC 6.18 (*)    MCV 67.3 (*)    MCH 20.2 (*)    RDW 18.0 (*)  All other components within  normal limits  URINALYSIS, ROUTINE W REFLEX MICROSCOPIC - Abnormal; Notable for the following components:   Color, Urine COLORLESS (*)    Specific Gravity, Urine 1.039 (*)    Glucose, UA >1,000 (*)    Ketones, ur 40 (*)    All other components within normal limits  I-STAT VENOUS BLOOD GAS, ED - Abnormal; Notable for the following components:   pCO2, Ven 38.4 (*)    All other components within normal limits  CBG MONITORING, ED - Abnormal; Notable for the following components:   Glucose-Capillary 361 (*)    All other components within normal limits  LIPASE, BLOOD  PREGNANCY, URINE  BLOOD GAS, VENOUS  OCCULT BLOOD X 1 CARD TO LAB, STOOL    EKG None  Radiology CT Abdomen Pelvis W Contrast  Result Date: 06/29/2021 CLINICAL DATA:  Abdominal distension EXAM: CT ABDOMEN AND PELVIS WITH CONTRAST TECHNIQUE: Multidetector CT imaging of the abdomen and pelvis was performed using the standard protocol following bolus administration of intravenous contrast. CONTRAST:  OMNIPAQUE IOHEXOL 300 MG/ML  SOLN COMPARISON:  None. FINDINGS: Lower chest: Lung bases are clear. No effusions. Heart is normal size. Hepatobiliary: Diffuse low-density throughout the liver compatible with fatty infiltration. No focal abnormality. Gallbladder unremarkable. Pancreas: No focal abnormality or ductal dilatation. Spleen: No focal abnormality.  Normal size. Adrenals/Urinary Tract: No adrenal abnormality. No focal renal abnormality. No stones or hydronephrosis. Urinary bladder is unremarkable. Stomach/Bowel: Normal appendix. Stomach, large and small bowel grossly unremarkable. Vascular/Lymphatic: No evidence of aneurysm or adenopathy. Reproductive: Uterus and adnexa unremarkable.  No mass. Other: No free fluid or free air. Musculoskeletal: No acute bony abnormality. IMPRESSION: No acute findings Hepatic steatosis. Electronically Signed   By: Charlett Nose M.D.   On: 06/29/2021 22:25    Procedures Procedures   Medications  Ordered in ED Medications  polyethylene glycol (MIRALAX / GLYCOLAX) packet 17 g (has no administration in time range)  sucralfate (CARAFATE) 1 GM/10ML suspension 1 g (has no administration in time range)  lactated ringers bolus 1,000 mL (1,000 mLs Intravenous New Bag/Given 06/29/21 2230)  famotidine (PEPCID) IVPB 20 mg premix (20 mg Intravenous New Bag/Given 06/29/21 2233)  iohexol (OMNIPAQUE) 300 MG/ML solution 100 mL (100 mLs Intravenous Contrast Given 06/29/21 2217)    ED Course  I have reviewed the triage vital signs and the nursing notes.  Pertinent labs & imaging results that were available during my care of the patient were reviewed by me and considered in my medical decision making (see chart for details).    MDM Rules/Calculators/A&P                           Patient reports about 6 months of epigastric pain.  CT scan does not show any acute findings.  At this time I have highest suspicion for gastritis or peptic ulcer disease.  Patient reports he has been on PPI continuously.  At this time will add Carafate and Pepcid.  Patient also reports feeling constipated.  Will try MiraLAX as well.  Patient does have follow-up with GI.  At this time from GI perspective stable.  Hemoglobin is stable without signs of anemia.  Patient reports bright red bleeding with bowel movement more consistent with hemorrhoidal or lower rectal bleeding.  Patient is diabetic.  She reports she takes metformin.  She reports she missed a dose because of changing Persis.  No signs of DKA or hyperosmolar crisis.  Mental  status is clear.  No acidosis.  Patient counseled on holding her metformin until 48 hours past since contrast material.  Follow-up plan reviewed. Final Clinical Impression(s) / ED Diagnoses Final diagnoses:  Epigastric pain    Rx / DC Orders ED Discharge Orders          Ordered    famotidine (PEPCID) 20 MG tablet  2 times daily        06/29/21 2350    sucralfate (CARAFATE) 1 g tablet  3 times  daily with meals & bedtime        06/29/21 2350    polyethylene glycol (MIRALAX / GLYCOLAX) 17 g packet  Daily        06/29/21 2350             Arby Barrette, MD 06/29/21 2353

## 2022-02-24 ENCOUNTER — Other Ambulatory Visit: Payer: Self-pay

## 2022-02-24 ENCOUNTER — Emergency Department (HOSPITAL_BASED_OUTPATIENT_CLINIC_OR_DEPARTMENT_OTHER): Payer: Medicaid Other

## 2022-02-24 ENCOUNTER — Emergency Department (HOSPITAL_BASED_OUTPATIENT_CLINIC_OR_DEPARTMENT_OTHER)
Admission: EM | Admit: 2022-02-24 | Discharge: 2022-02-24 | Disposition: A | Discharge status: Home / Self Care | Payer: Medicaid Other | Attending: Emergency Medicine | Admitting: Emergency Medicine

## 2022-02-24 ENCOUNTER — Encounter (HOSPITAL_BASED_OUTPATIENT_CLINIC_OR_DEPARTMENT_OTHER): Payer: Self-pay | Admitting: Emergency Medicine

## 2022-02-24 DIAGNOSIS — D649 Anemia, unspecified: Secondary | ICD-10-CM | POA: Diagnosis not present

## 2022-02-24 DIAGNOSIS — R002 Palpitations: Secondary | ICD-10-CM

## 2022-02-24 DIAGNOSIS — E1165 Type 2 diabetes mellitus with hyperglycemia: Secondary | ICD-10-CM | POA: Insufficient documentation

## 2022-02-24 DIAGNOSIS — R200 Anesthesia of skin: Secondary | ICD-10-CM | POA: Diagnosis not present

## 2022-02-24 DIAGNOSIS — Z7984 Long term (current) use of oral hypoglycemic drugs: Secondary | ICD-10-CM | POA: Diagnosis not present

## 2022-02-24 LAB — CBC WITH DIFFERENTIAL/PLATELET
Abs Immature Granulocytes: 0.03 10*3/uL (ref 0.00–0.07)
Basophils Absolute: 0 10*3/uL (ref 0.0–0.1)
Basophils Relative: 0 %
Eosinophils Absolute: 0.3 10*3/uL (ref 0.0–0.5)
Eosinophils Relative: 4 %
HCT: 37.9 % (ref 36.0–46.0)
Hemoglobin: 11.6 g/dL — ABNORMAL LOW (ref 12.0–15.0)
Immature Granulocytes: 0 %
Lymphocytes Relative: 28 %
Lymphs Abs: 1.9 10*3/uL (ref 0.7–4.0)
MCH: 21.6 pg — ABNORMAL LOW (ref 26.0–34.0)
MCHC: 30.6 g/dL (ref 30.0–36.0)
MCV: 70.6 fL — ABNORMAL LOW (ref 80.0–100.0)
Monocytes Absolute: 0.4 10*3/uL (ref 0.1–1.0)
Monocytes Relative: 5 %
Neutro Abs: 4.3 10*3/uL (ref 1.7–7.7)
Neutrophils Relative %: 63 %
Platelets: 288 10*3/uL (ref 150–400)
RBC: 5.37 MIL/uL — ABNORMAL HIGH (ref 3.87–5.11)
RDW: 14.9 % (ref 11.5–15.5)
WBC: 6.9 10*3/uL (ref 4.0–10.5)
nRBC: 0 % (ref 0.0–0.2)

## 2022-02-24 LAB — BASIC METABOLIC PANEL
Anion gap: 10 (ref 5–15)
BUN: 9 mg/dL (ref 6–20)
CO2: 22 mmol/L (ref 22–32)
Calcium: 9.3 mg/dL (ref 8.9–10.3)
Chloride: 103 mmol/L (ref 98–111)
Creatinine, Ser: 0.5 mg/dL (ref 0.44–1.00)
GFR, Estimated: >60 mL/min (ref >60)
Glucose, Bld: 312 mg/dL — ABNORMAL HIGH (ref 70–99)
Potassium: 3.8 mmol/L (ref 3.5–5.1)
Sodium: 135 mmol/L (ref 135–145)

## 2022-02-24 LAB — TSH: TSH: 1.162 u[IU]/mL (ref 0.350–4.500)

## 2022-02-24 LAB — PREGNANCY, URINE: Preg Test, Ur: NEGATIVE

## 2022-02-24 LAB — TROPONIN I (HIGH SENSITIVITY): Troponin I (High Sensitivity): 2 ng/L (ref <18)

## 2022-02-24 LAB — D-DIMER, QUANTITATIVE: D-Dimer, Quant: 0.37 ug/mL-FEU (ref 0.00–0.50)

## 2022-02-24 MED ORDER — FLUCONAZOLE 150 MG PO TABS: 150.0000 mg | ORAL_TABLET | Freq: Once | ORAL | 0 refills | Status: AC

## 2022-02-24 NOTE — ED Notes (Signed)
Lab to add trop and A1C to current sample collected at 1500

## 2022-02-24 NOTE — ED Triage Notes (Signed)
Patient presents to ED via POV from home. Patient reports chest pain and palpations "for months". Denies nausea and vomiting.

## 2022-02-24 NOTE — Discharge Instructions (Addendum)
Please return to ED with any new concerns or symptoms such as chest pain, shortness of breath, leg swelling Please follow-up with your cardiologist/PCP for further management Please pick up medication of sent in for you Please read attached informational guide concerning palpitations Please follow-up on the results of your A1c with your today and discussed with your PCP

## 2022-02-24 NOTE — ED Provider Notes (Signed)
[facility] EMERGENCY DEPARTMENT Provider Note   CSN: [id number] Arrival date & time: 02/24/22  1238     History  Chief Complaint  Patient presents with   Palpitations    Audrey Edwards is a 41 y.o. female with medical history significant for heroin abuse, anxiety, depression, diabetes, palpitations.  The patient reports to the ED for evaluation of palpitations.  Patient reports that for the last 6 months that she has had palpitations but these palpitations have become more frequent in the last 1 week.  Patient reports the last episode of palpitations she experienced was yesterday.  Patient describes these feelings of palpitations as "like trying to push jelly through a straw" and describes this sensation as going from left to right across her chest.  On chart review, it appears that this patient has been seen in the ED for palpitations in the past.  Patient reports that she does see a cardiologist however states that she has not seen him in quite some time.  Patient also states that she experiences intermittent right foot numbness that is also going on for about the last 6 months.  Patient denies any recent trauma, back pain.  Patient reports being noncompliant on diabetic medication because "she cannot remember to take medication every day". Patient voices desire to be placed on ozempic. Patient denies any recent fevers, nausea, vomiting, diarrhea, shortness of breath, leg swelling, unilateral weakness, unilateral numbness, blood in stool.  Patient reports she is currently on birth control, denies any recent travel, denies history of blood clots.   Palpitations Associated symptoms: no nausea, no numbness, no shortness of breath, no vomiting and no weakness        Home Medications Prior to Admission medications   Medication Sig Start Date End Date Taking? Authorizing Provider  fluconazole (DIFLUCAN) 150 MG tablet Take 1 tablet (150 mg total) by mouth once for 1 dose. 02/24/22  02/24/22 Yes Al Decant, PA-C  clarithromycin (BIAXIN) 500 MG tablet Take 1 tablet (500 mg total) by mouth 2 (two) times daily. 12/17/19   Olalere, Onnie Boer A, MD  clindamycin (CLEOCIN T) 1 % external solution Apply 1 application topically in the morning and at bedtime. On face lesions 11/27/19   [provider]  etonogestrel-ethinyl estradiol (NUVARING) 0.12-0.015 MG/24HR vaginal ring Place 1 each vaginally See admin instructions. For 3 consecutive weeks, then remove for 1 week. 12/10/19   [provider]  famotidine (PEPCID) 20 MG tablet Take 1 tablet (20 mg total) by mouth 2 (two) times daily. 06/29/21   Arby Barrette, MD  fluticasone (FLOVENT HFA) 110 MCG/ACT inhaler Inhale 2 puffs into the lungs in the morning and at bedtime. 12/17/19   Tomma Lightning, MD  metFORMIN (GLUCOPHAGE) 500 MG tablet Take 1 tablet (500 mg total) by mouth 2 (two) times daily with a meal. 12/07/19   Palumbo, April, MD  pentoxifylline (TRENTAL) 400 MG CR tablet Take 400 mg by mouth in the morning and at bedtime. 10/29/19   [provider]  polyethylene glycol (MIRALAX / GLYCOLAX) 17 g packet Take 17 g by mouth daily. 06/29/21   Arby Barrette, MD  predniSONE (DELTASONE) 20 MG tablet Take 2 tablets (40 mg total) by mouth daily with breakfast. For the next four days 12/15/19   Gerhard Munch, MD  sucralfate (CARAFATE) 1 g tablet Take 1 tablet (1 g total) by mouth 4 (four) times daily -  with meals and at bedtime. 06/29/21   Arby Barrette, MD  Allergies    Onion    Review of Systems   Review of Systems  Constitutional:  Negative for fever.  Respiratory:  Negative for shortness of breath.   Cardiovascular:  Positive for palpitations. Negative for leg swelling.  Gastrointestinal:  Negative for blood in stool, diarrhea, nausea and vomiting.  Neurological:  Negative for weakness and numbness.    Physical Exam Updated Vital Signs BP (!) 132/95   Pulse 74   Temp 98.6 F (37 C)    Resp (!) 21   Ht 5\' 1"  (1.549 m)   Wt 59 kg   SpO2 100%   BMI 24.56 kg/m  Physical Exam Vitals and nursing note reviewed.  Constitutional:      General: She is not in acute distress.    Appearance: Normal appearance. She is not ill-appearing, toxic-appearing or diaphoretic.  HENT:     Head: Normocephalic and atraumatic.     Nose: Nose normal. No congestion.     Mouth/Throat:     Mouth: Mucous membranes are moist.     Pharynx: Oropharynx is clear. No oropharyngeal exudate or posterior oropharyngeal erythema.  Eyes:     Extraocular Movements: Extraocular movements intact.     Conjunctiva/sclera: Conjunctivae normal.     Pupils: Pupils are equal, round, and reactive to light.  Cardiovascular:     Rate and Rhythm: Normal rate and regular rhythm.     Pulses: Normal pulses.     Heart sounds: No murmur heard.    No gallop.  Pulmonary:     Effort: Pulmonary effort is normal.     Breath sounds: Normal breath sounds. No wheezing.  Abdominal:     General: Abdomen is flat. Bowel sounds are normal.     Palpations: Abdomen is soft.     Tenderness: There is no abdominal tenderness.  Musculoskeletal:     Cervical back: Normal range of motion and neck supple. No tenderness.  Skin:    General: Skin is warm and dry.     Capillary Refill: Capillary refill takes less than 2 seconds.  Neurological:     Mental Status: She is alert and oriented to person, place, and time.     GCS: GCS eye subscore is 4. GCS verbal subscore is 5. GCS motor subscore is 6.     Cranial Nerves: Cranial nerves 2-12 are intact. No cranial nerve deficit.     Sensory: No sensory deficit.     Motor: Motor function is intact. No weakness.     Coordination: Coordination is intact. Heel to [facility] Test normal.     ED Results / Procedures / Treatments   Labs (all labs ordered are listed, but only abnormal results are displayed) Labs Reviewed  CBC WITH DIFFERENTIAL/PLATELET - Abnormal; Notable for the following components:       Result Value   RBC 5.37 (*)    Hemoglobin 11.6 (*)    MCV 70.6 (*)    MCH 21.6 (*)    All other components within normal limits  BASIC METABOLIC PANEL - Abnormal; Notable for the following components:   Glucose, Bld 312 (*)    All other components within normal limits  PREGNANCY, URINE  D-DIMER, QUANTITATIVE  TSH  HEMOGLOBIN A1C  TROPONIN I (HIGH SENSITIVITY)    EKG EKG Interpretation  Date/Time:  Friday February 24 2022 12:55:56 EDT Ventricular Rate:  86 PR Interval:  128 QRS Duration: 78 QT Interval:  386 QTC Calculation: 461 R Axis:   31 Text Interpretation: Normal sinus  rhythm Normal ECG When compared with ECG of 15-Dec-2019 04:17, PREVIOUS ECG IS PRESENT Confirmed by Virgina Norfolk [phone]) on 02/24/2022 1:08:03 PM  Radiology DG Chest 2 View  Result Date: 02/24/2022 CLINICAL DATA:  Pain. EXAM: CHEST - 2 VIEW COMPARISON:  None Available. FINDINGS: The heart size and mediastinal contours are within normal limits. Both lungs are clear. No pleural effusion or pneumothorax. Previous right distal clavicle resection. No acute skeletal abnormality. IMPRESSION: No active cardiopulmonary disease. Electronically Signed   By: Amie Portland M.D.   On: 02/24/2022 13:57    Procedures Procedures   Medications Ordered in ED Medications - No data to display  ED Course/ Medical Decision Making/ A&P                           Medical Decision Making Amount and/or Complexity of Data Reviewed Labs: ordered.   41 year old female presents to ED for evaluation.  Please see HPI for further details.  On examination, patient afebrile and nontachycardic.  Patient lung sounds clear bilaterally, not hypoxic on room air.  Patient abdomen soft and compressible all 4 quadrants.  No focal neurodeficits on examination.  Patient worked up utilizing the following labs and imaging studies interpreted by me personally: - D-dimer unremarkable - Pregnancy test negative - BMP shows elevated glucose of  312.  The patient states that she does not take her diabetic medication as prescribed.  I have counseled the patient on the importance of taking diabetic medication as prescribed and the effects of long-term uncontrolled blood sugar.  The patient voices understanding. - CBC shows slightly decreased hemoglobin 11.6 over the patient denies any blood in stool, lightheadedness, dizziness, weakness.  Patient reports she recently had colonoscopy - Troponin 2 - Hemoglobin A1c pending - TSH pending - Chest x-ray unremarkable, no consolidation or effusion - EKG nonischemic, sinus rhythm  Nonischemic EKG along with low-grade troponin leads me to rule out/highly doubt ACS.  The patient's D-dimer is negative so I doubt PE.  Patient could be suffering from PVCs.  Patient will be encouraged to follow back up with her cardiologist for further management.  The patient has been given return precautions and has voiced understanding.  The patient has had all her questions answered to her satisfaction.  The patient is stable at this time for discharge home.  Prior to discharging the patient, she requested that I send her home with medication for supposed yeast infection she is suffering from.  The patient reports that she gets yeast infections every month.  I advised her that she most likely gets recurrent yeast infections due to her uncontrolled diabetes.  Final Clinical Impression(s) / ED Diagnoses Final diagnoses:  Palpitations    Rx / DC Orders ED Discharge Orders          Ordered    fluconazole (DIFLUCAN) 150 MG tablet   Once        02/24/22 1720              Al Decant, PA-C 02/24/22 1721    Terald Sleeper, MD 02/24/22 [phone]

## 2022-02-25 LAB — HEMOGLOBIN A1C
Hgb A1c MFr Bld: 11.6 % — ABNORMAL HIGH (ref 4.8–5.6)
Mean Plasma Glucose: 286 mg/dL

## 2022-07-24 ENCOUNTER — Emergency Department (HOSPITAL_BASED_OUTPATIENT_CLINIC_OR_DEPARTMENT_OTHER)
Admission: EM | Admit: 2022-07-24 | Discharge: 2022-07-24 | Disposition: A | Discharge status: Home / Self Care | Payer: Medicaid Other | Attending: Emergency Medicine | Admitting: Emergency Medicine

## 2022-07-24 ENCOUNTER — Other Ambulatory Visit: Payer: Self-pay

## 2022-07-24 ENCOUNTER — Encounter (HOSPITAL_BASED_OUTPATIENT_CLINIC_OR_DEPARTMENT_OTHER): Payer: Self-pay | Admitting: Urology

## 2022-07-24 DIAGNOSIS — Z7984 Long term (current) use of oral hypoglycemic drugs: Secondary | ICD-10-CM | POA: Insufficient documentation

## 2022-07-24 DIAGNOSIS — H5789 Other specified disorders of eye and adnexa: Secondary | ICD-10-CM | POA: Insufficient documentation

## 2022-07-24 MED ORDER — FLUORESCEIN SODIUM 1 MG OP STRP
1.0000 | ORAL_STRIP | Freq: Once | OPHTHALMIC | Status: AC
Start: 2022-07-24 — End: 2022-07-24
  Administered 2022-07-24: 1 via OPHTHALMIC
  Filled 2022-07-24: qty 1

## 2022-07-24 MED ORDER — TETRACAINE HCL 0.5 % OP SOLN
2.0000 [drp] | Freq: Once | OPHTHALMIC | Status: AC
  Administered 2022-07-24: 2 [drp] via OPHTHALMIC
  Filled 2022-07-24: qty 4

## 2022-07-24 MED ORDER — ERYTHROMYCIN 5 MG/GM OP OINT
TOPICAL_OINTMENT | Freq: Four times a day (QID) | OPHTHALMIC | Status: DC
  Administered 2022-07-24: 1 via OPHTHALMIC
  Filled 2022-07-24: qty 3.5

## 2022-07-24 NOTE — ED Triage Notes (Signed)
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   Candis Shine taking valtrex Pain radiating to head, light sensitivity   States concern for dizziness when looking straight up and closing eyes

## 2022-07-24 NOTE — Discharge Instructions (Signed)
Follow-up with ophthalmologist tomorrow.  Use antibiotic ointment every 6 hours for the next several days.

## 2022-07-24 NOTE — ED Provider Notes (Signed)
[facility] EMERGENCY DEPARTMENT Provider Note   CSN: [id number] Arrival date & time: 07/24/22  1752     History  Chief Complaint  Patient presents with   Eye Problem    Audrey Edwards is a 41 y.o. female.  Patient here with right eye irritation.  She states she has had issues in the past with this as well.  She has had shingles in the eye.  She denies any trauma.  Nothing makes it worse or better.  She has felt a little bit off balance when she moves her eyes at times but nothing currently.  No nausea or vomiting.  No vision loss, no drainage, no trauma.  The history is provided by the patient.       Home Medications Prior to Admission medications   Medication Sig Start Date End Date Taking? Authorizing Provider  clarithromycin (BIAXIN) 500 MG tablet Take 1 tablet (500 mg total) by mouth 2 (two) times daily. 12/17/19   Olalere, Onnie Boer A, MD  clindamycin (CLEOCIN T) 1 % external solution Apply 1 application topically in the morning and at bedtime. On face lesions 11/27/19   [provider]  etonogestrel-ethinyl estradiol (NUVARING) 0.12-0.015 MG/24HR vaginal ring Place 1 each vaginally See admin instructions. For 3 consecutive weeks, then remove for 1 week. 12/10/19   [provider]  famotidine (PEPCID) 20 MG tablet Take 1 tablet (20 mg total) by mouth 2 (two) times daily. 06/29/21   Arby Barrette, MD  fluticasone (FLOVENT HFA) 110 MCG/ACT inhaler Inhale 2 puffs into the lungs in the morning and at bedtime. 12/17/19   Tomma Lightning, MD  metFORMIN (GLUCOPHAGE) 500 MG tablet Take 1 tablet (500 mg total) by mouth 2 (two) times daily with a meal. 12/07/19   Palumbo, April, MD  pentoxifylline (TRENTAL) 400 MG CR tablet Take 400 mg by mouth in the morning and at bedtime. 10/29/19   [provider]  polyethylene glycol (MIRALAX / GLYCOLAX) 17 g packet Take 17 g by mouth daily. 06/29/21   Arby Barrette, MD  predniSONE (DELTASONE) 20 MG tablet Take 2  tablets (40 mg total) by mouth daily with breakfast. For the next four days 12/15/19   Gerhard Munch, MD  sucralfate (CARAFATE) 1 g tablet Take 1 tablet (1 g total) by mouth 4 (four) times daily -  with meals and at bedtime. 06/29/21   Arby Barrette, MD      Allergies    Onion    Review of Systems   Review of Systems  Physical Exam Updated Vital Signs BP (!) 157/92 (BP Location: Right Arm)   Pulse 77   Temp 98.1 F (36.7 C) (Oral)   Resp 18   Ht 5\' 1"  (1.549 m)   Wt 59 kg   LMP 06/27/2022 (Approximate)   SpO2 100%   BMI 24.58 kg/m  Physical Exam Vitals and nursing note reviewed.  Constitutional:      General: She is not in acute distress.    Appearance: She is well-developed.  HENT:     Head: Normocephalic and atraumatic.     Mouth/Throat:     Mouth: Mucous membranes are moist.  Eyes:     Extraocular Movements: Extraocular movements intact.     Comments: Right eye conjunctiva is inflamed, Tono-Pen pressure in the right eye is 17, fluorescein uptake is negative, no obvious dendritic lesions in the right eye, visual acuity is 20/20, normal extraocular movements of the eye,  Cardiovascular:     Rate  and Rhythm: Normal rate and regular rhythm.     Pulses: Normal pulses.     Heart sounds: Normal heart sounds. No murmur heard. Pulmonary:     Effort: Pulmonary effort is normal. No respiratory distress.     Breath sounds: Normal breath sounds.  Abdominal:     Palpations: Abdomen is soft.     Tenderness: There is no abdominal tenderness.  Musculoskeletal:        General: No swelling.     Cervical back: Neck supple.  Skin:    General: Skin is warm and dry.     Capillary Refill: Capillary refill takes less than 2 seconds.  Neurological:     General: No focal deficit present.     Mental Status: She is alert and oriented to person, place, and time.     Cranial Nerves: No cranial nerve deficit.     Sensory: No sensory deficit.     Motor: No weakness.     Coordination:  Coordination normal.  Psychiatric:        Mood and Affect: Mood normal.     ED Results / Procedures / Treatments   Labs (all labs ordered are listed, but only abnormal results are displayed) Labs Reviewed - No data to display  EKG None  Radiology No results found.  Procedures Procedures    Medications Ordered in ED Medications  erythromycin ophthalmic ointment (1 Application Right Eye Given 07/24/22 2033)  fluorescein ophthalmic strip 1 strip (1 strip Both Eyes Given 07/24/22 2034)  tetracaine (PONTOCAINE) 0.5 % ophthalmic solution 2 drop (2 drops Both Eyes Given 07/24/22 2034)    ED Course/ Medical Decision Making/ A&P                           Medical Decision Making Risk Prescription drug management.   GWENEVERE GOGA is here with right eye irritation.  Normal vitals.  No fever.  Possible history of shingles in the right eye.  Overall eye exam is unremarkable.  She has inflammation of the conjunctiva.  She has eye movement and equal reactive pupils.  Eye pressure is normal.  Fluorescein uptake is negative.  I do not see any signs to suggest shingles or herpes virus in the eye.  Will start her on erythromycin and have her follow-up with her eye doctor tomorrow for better exam.  She understands return precautions.  Discharged in good condition.  This chart was dictated using voice recognition software.  Despite best efforts to proofread,  errors can occur which can change the documentation meaning.         Final Clinical Impression(s) / ED Diagnoses Final diagnoses:  Eye irritation    Rx / DC Orders ED Discharge Orders     None         Virgina Norfolk, DO 07/24/22 2045

## 2023-08-28 ENCOUNTER — Other Ambulatory Visit (HOSPITAL_COMMUNITY)
Admission: RE | Admit: 2023-08-28 | Discharge: 2023-08-28 | Disposition: A | Discharge status: Home / Self Care | Payer: Medicaid Other | Source: Ambulatory Visit | Attending: Nurse Practitioner | Admitting: Nurse Practitioner

## 2023-08-28 ENCOUNTER — Other Ambulatory Visit: Payer: Self-pay | Admitting: Nurse Practitioner

## 2023-08-28 DIAGNOSIS — Z124 Encounter for screening for malignant neoplasm of cervix: Secondary | ICD-10-CM | POA: Diagnosis present

## 2023-09-04 LAB — CYTOLOGY - PAP
Comment: NEGATIVE
Diagnosis: NEGATIVE
High risk HPV: NEGATIVE
